# Patient Record
Sex: Female | Born: 1970
Health system: Southern US, Community
[De-identification: ages and names within clinical notes are randomized; demographics above are authoritative.]

## PROBLEM LIST (undated history)

## (undated) DIAGNOSIS — IMO0002 Reserved for concepts with insufficient information to code with codable children: Secondary | ICD-10-CM

## (undated) DIAGNOSIS — E7800 Pure hypercholesterolemia, unspecified: Secondary | ICD-10-CM

## (undated) DIAGNOSIS — I10 Essential (primary) hypertension: Secondary | ICD-10-CM

## (undated) HISTORY — DX: Reserved for concepts with insufficient information to code with codable children: IMO0002

## (undated) HISTORY — PX: TUBAL LIGATION: SHX77

## (undated) HISTORY — PX: COLPOSCOPY: SHX161

## (undated) HISTORY — PX: KNEE SURGERY: SHX244

## (undated) HISTORY — DX: Essential (primary) hypertension: I10

## (undated) HISTORY — DX: Pure hypercholesterolemia, unspecified: E78.00

---

## 1997-10-06 ENCOUNTER — Other Ambulatory Visit: Admission: RE | Admit: 1997-10-06 | Discharge: 1997-10-06 | Payer: Self-pay | Admitting: Obstetrics and Gynecology

## 1998-11-26 ENCOUNTER — Other Ambulatory Visit: Admission: RE | Admit: 1998-11-26 | Discharge: 1998-11-26 | Payer: Self-pay | Admitting: Obstetrics and Gynecology

## 1999-12-08 ENCOUNTER — Other Ambulatory Visit: Admission: RE | Admit: 1999-12-08 | Discharge: 1999-12-08 | Payer: Self-pay | Admitting: Obstetrics and Gynecology

## 2000-03-01 ENCOUNTER — Encounter: Payer: Self-pay | Admitting: Urology

## 2000-03-01 ENCOUNTER — Encounter: Admission: RE | Admit: 2000-03-01 | Discharge: 2000-03-01 | Payer: Self-pay | Admitting: Urology

## 2000-12-12 ENCOUNTER — Other Ambulatory Visit: Admission: RE | Admit: 2000-12-12 | Discharge: 2000-12-12 | Payer: Self-pay | Admitting: Obstetrics and Gynecology

## 2001-04-03 ENCOUNTER — Encounter: Admission: RE | Admit: 2001-04-03 | Discharge: 2001-07-02 | Payer: Self-pay | Admitting: Gynecology

## 2001-09-17 ENCOUNTER — Encounter (INDEPENDENT_AMBULATORY_CARE_PROVIDER_SITE_OTHER): Payer: Self-pay | Admitting: Specialist

## 2001-09-17 ENCOUNTER — Inpatient Hospital Stay (HOSPITAL_COMMUNITY): Admission: AD | Admit: 2001-09-17 | Discharge: 2001-09-20 | Payer: Self-pay | Admitting: Gynecology

## 2001-10-30 ENCOUNTER — Other Ambulatory Visit: Admission: RE | Admit: 2001-10-30 | Discharge: 2001-10-30 | Payer: Self-pay | Admitting: Gynecology

## 2002-06-05 ENCOUNTER — Other Ambulatory Visit: Admission: RE | Admit: 2002-06-05 | Discharge: 2002-06-05 | Payer: Self-pay | Admitting: Gynecology

## 2002-12-01 ENCOUNTER — Other Ambulatory Visit: Admission: RE | Admit: 2002-12-01 | Discharge: 2002-12-01 | Payer: Self-pay | Admitting: Gynecology

## 2004-01-05 ENCOUNTER — Other Ambulatory Visit: Admission: RE | Admit: 2004-01-05 | Discharge: 2004-01-05 | Payer: Self-pay | Admitting: Gynecology

## 2004-09-02 ENCOUNTER — Other Ambulatory Visit: Admission: RE | Admit: 2004-09-02 | Discharge: 2004-09-02 | Payer: Self-pay | Admitting: Gynecology

## 2005-03-14 ENCOUNTER — Encounter (INDEPENDENT_AMBULATORY_CARE_PROVIDER_SITE_OTHER): Payer: Self-pay | Admitting: Specialist

## 2005-03-14 ENCOUNTER — Inpatient Hospital Stay (HOSPITAL_COMMUNITY): Admission: RE | Admit: 2005-03-14 | Discharge: 2005-03-17 | Payer: Self-pay | Admitting: Gynecology

## 2005-04-20 ENCOUNTER — Other Ambulatory Visit: Admission: RE | Admit: 2005-04-20 | Discharge: 2005-04-20 | Payer: Self-pay | Admitting: Gynecology

## 2005-11-09 ENCOUNTER — Other Ambulatory Visit: Admission: RE | Admit: 2005-11-09 | Discharge: 2005-11-09 | Payer: Self-pay | Admitting: Gynecology

## 2005-12-23 HISTORY — PX: ENDOMETRIAL ABLATION: SHX621

## 2005-12-28 ENCOUNTER — Ambulatory Visit (HOSPITAL_COMMUNITY): Admission: RE | Admit: 2005-12-28 | Discharge: 2005-12-28 | Payer: Self-pay | Admitting: Gynecology

## 2005-12-28 ENCOUNTER — Encounter (INDEPENDENT_AMBULATORY_CARE_PROVIDER_SITE_OTHER): Payer: Self-pay | Admitting: Specialist

## 2006-01-23 DIAGNOSIS — IMO0002 Reserved for concepts with insufficient information to code with codable children: Secondary | ICD-10-CM

## 2006-01-23 HISTORY — DX: Reserved for concepts with insufficient information to code with codable children: IMO0002

## 2006-04-30 ENCOUNTER — Other Ambulatory Visit: Admission: RE | Admit: 2006-04-30 | Discharge: 2006-04-30 | Payer: Self-pay | Admitting: Gynecology

## 2006-10-30 ENCOUNTER — Other Ambulatory Visit: Admission: RE | Admit: 2006-10-30 | Discharge: 2006-10-30 | Payer: Self-pay | Admitting: Gynecology

## 2006-11-24 HISTORY — PX: CERVICAL BIOPSY  W/ LOOP ELECTRODE EXCISION: SUR135

## 2007-05-01 ENCOUNTER — Other Ambulatory Visit: Admission: RE | Admit: 2007-05-01 | Discharge: 2007-05-01 | Payer: Self-pay | Admitting: Gynecology

## 2007-11-19 ENCOUNTER — Encounter: Payer: Self-pay | Admitting: Gynecology

## 2007-11-19 ENCOUNTER — Other Ambulatory Visit: Admission: RE | Admit: 2007-11-19 | Discharge: 2007-11-19 | Payer: Self-pay | Admitting: Gynecology

## 2007-11-19 ENCOUNTER — Ambulatory Visit: Payer: Self-pay | Admitting: Gynecology

## 2008-04-29 ENCOUNTER — Ambulatory Visit: Payer: Self-pay | Admitting: Gynecology

## 2008-04-29 ENCOUNTER — Encounter: Payer: Self-pay | Admitting: Gynecology

## 2008-04-29 ENCOUNTER — Other Ambulatory Visit: Admission: RE | Admit: 2008-04-29 | Discharge: 2008-04-29 | Payer: Self-pay | Admitting: Gynecology

## 2008-05-11 ENCOUNTER — Other Ambulatory Visit: Admission: RE | Admit: 2008-05-11 | Discharge: 2008-05-11 | Payer: Self-pay | Admitting: Gynecology

## 2008-05-11 ENCOUNTER — Encounter: Payer: Self-pay | Admitting: Gynecology

## 2008-05-11 ENCOUNTER — Ambulatory Visit: Payer: Self-pay | Admitting: Gynecology

## 2008-06-26 ENCOUNTER — Ambulatory Visit: Payer: Self-pay | Admitting: Gynecology

## 2008-09-29 ENCOUNTER — Ambulatory Visit: Payer: Self-pay | Admitting: Gynecology

## 2008-11-12 ENCOUNTER — Encounter: Payer: Self-pay | Admitting: Gynecology

## 2008-11-12 ENCOUNTER — Other Ambulatory Visit: Admission: RE | Admit: 2008-11-12 | Discharge: 2008-11-12 | Payer: Self-pay | Admitting: Gynecology

## 2008-11-12 ENCOUNTER — Ambulatory Visit: Payer: Self-pay | Admitting: Gynecology

## 2009-04-30 ENCOUNTER — Other Ambulatory Visit: Admission: RE | Admit: 2009-04-30 | Discharge: 2009-04-30 | Payer: Self-pay | Admitting: Gynecology

## 2009-04-30 ENCOUNTER — Ambulatory Visit: Payer: Self-pay | Admitting: Gynecology

## 2009-10-06 ENCOUNTER — Ambulatory Visit: Payer: Self-pay | Admitting: Gynecology

## 2010-05-02 ENCOUNTER — Encounter: Payer: Self-pay | Admitting: Gynecology

## 2010-05-16 ENCOUNTER — Encounter (INDEPENDENT_AMBULATORY_CARE_PROVIDER_SITE_OTHER): Payer: BC Managed Care – PPO | Admitting: Gynecology

## 2010-05-16 ENCOUNTER — Other Ambulatory Visit (HOSPITAL_COMMUNITY)
Admission: RE | Admit: 2010-05-16 | Discharge: 2010-05-16 | Disposition: A | Discharge status: Home / Self Care | Payer: BC Managed Care – PPO | Source: Ambulatory Visit | Attending: Gynecology | Admitting: Gynecology

## 2010-05-16 ENCOUNTER — Other Ambulatory Visit: Payer: Self-pay | Admitting: Gynecology

## 2010-05-16 DIAGNOSIS — Z13220 Encounter for screening for lipoid disorders: Secondary | ICD-10-CM

## 2010-05-16 DIAGNOSIS — Z124 Encounter for screening for malignant neoplasm of cervix: Secondary | ICD-10-CM | POA: Insufficient documentation

## 2010-05-16 DIAGNOSIS — Z01419 Encounter for gynecological examination (general) (routine) without abnormal findings: Secondary | ICD-10-CM

## 2010-05-16 DIAGNOSIS — Z833 Family history of diabetes mellitus: Secondary | ICD-10-CM

## 2010-06-10 NOTE — H&P (Signed)
   Lisa Wang, Lisa Wang                           ACCOUNT NO.:  [id number]   MEDICAL RECORD NO.:  [id number]                   PATIENT TYPE:  INP   LOCATION:  9143                                 FACILITY:  [facility]   PHYSICIAN:  Timothy P. Fontaine, M.D.           DATE OF BIRTH:  09/02/1970   DATE OF ADMISSION:  09/17/2001  DATE OF DISCHARGE:                                HISTORY & PHYSICAL   A 40 year old G-1, P-0 at [redacted] weeks gestation originally scheduled for  primary cesarean section due to breech presentation as per already dictated  history and physical for September 20, 2001. The patient presented today in  labor and is admitted for her cesarean section.  Please see the remainder of  her previously dictated H&P.                                               Timothy P. Audie Box, M.D.    TPF/MEDQ  D:  09/17/2001  T:  09/17/2001  Job:  [id number]

## 2010-06-10 NOTE — Op Note (Signed)
Lisa Wang, Lisa Wang                 ACCOUNT NO.:  [id number]   MEDICAL RECORD NO.:  [id number]          PATIENT TYPE:  INP   LOCATION:  9199                          FACILITY:  [facility]   PHYSICIAN:  Timothy P. Fontaine, M.D.DATE OF BIRTH:  Mar 20, 1970   DATE OF PROCEDURE:  03/14/2005  DATE OF DISCHARGE:                                 OPERATIVE REPORT   PREOPERATIVE DIAGNOSES:  1.  Term pregnancy.  2.  Prior cesarean section for repeat cesarean section.  3.  Desires permanent sterilization.   POSTOPERATIVE DIAGNOSES:  1.  Term pregnancy.  2.  Prior cesarean section for repeat cesarean section.  3.  Desires permanent sterilization.  4.  Left broad ligament leiomyoma.   PROCEDURE:  Repeat low transverse cervical cesarean section and bilateral  tubal sterilization, excision left broad ligament leiomyoma.   SURGEON:  Timothy P. Fontaine, M.D.   ASSISTANT:  Ivor Costa. Farrel Gobble, M.D.   ANESTHETIC:  Spinal.   SPECIMEN:  1.  Samples of cord blood.  2.  Cord blood banking.  3.  Left broad ligament leiomyoma.  4.  Portions of left and right fallopian tubes.   ESTIMATED BLOOD LOSS:  Less than 500 mL.   COMPLICATIONS:  None.   FINDINGS:  At 7:45 normal female infant, Apgars 09/09, weight 8 pounds 10  ounces. Pelvic anatomy overall was noted to be normal with exception of a  small 1.5-2 cm firm the left broad ligament mass consistent with a free  leiomyoma which was excised. The remainder of the pelvic anatomy was noted  to be normal.   PROCEDURE:  The patient was taken to the operating room, underwent spinal  anesthesia, was placed left tilt supine position, received abdominal  preparation with Betadine solution. Bladder emptied with indwelling Foley  catheterization placed in sterile technique per nursing personnel. The  patient was draped in usual fashion. After assuring adequate anesthesia the  abdomen sharply entered through repeat Pfannenstiel incision achieving  adequate  hemostasis at all levels. The bladder flap was sharply bluntly  developed without difficulty. The uterus was sharply entered in the lower  uterine segment bluntly extended laterally. The membranes were ruptured. The  fluid noted to be clear. The infant's head was delivered through the  incision. Nares and mouth were suctioned. Nuchal cord x1 reduced. The rest  infant delivered. Cord doubly clamped and cut. Infant handed to pediatrics  in attendance. Samples of cord blood were obtained. Placenta was  spontaneously extruded and passed off for cord blood banking retrieval.  Uterus was exteriorized. Endometrial cavity explored with sponge to remove  all placental membrane fragments. The patient received 1 gram antibiotic  prophylaxis at this time. The uterine incision was closed in two layers  using 0 Vicryl suture, first a running interlocking stitch followed by  imbricating stitch. The right fallopian tube was identified, traced from its  insertion to its fimbriated end. Mid tubal segment was doubly ligated using  0 plain suture and the intervening segment excised. Tubal lumen as well as  adequate hemostasis was grossly identified. A similar procedure was carried  out  on the other side. The left broad ligament mass was identified and the  overlying peritoneum was sharply incised. The mass was bluntly and delivered  sent to pathology. The 0 Vicryl interrupted the suture was placed for  hemostasis. The uterus was then returned to the abdomen which was copiously  irrigated and adequate hemostasis was visualized. The anterior fascia was  then reapproximated using 0 Vicryl suture in a running stitch starting at  the angle, meeting in the middle. Subcutaneous tissues irrigated. Adequate  hemostasis achieved with electrocautery. Skin was reapproximated using 4-0  Vicryl in running subcuticular stitch. Steri-Strips, Benzoin applied.  Pressure dressing applied. The patient was taken to recovery room in  good  condition having tolerated procedure well.      Timothy P. Fontaine, M.D.  Electronically Signed     TPF/MEDQ  D:  03/14/2005  T:  03/14/2005  Job:  [id number]

## 2010-06-10 NOTE — Discharge Summary (Signed)
[name], [name]                 ACCOUNT NO.:  [id number]   MEDICAL RECORD NO.:  [id number]          PATIENT TYPE:  INP   LOCATION:  9113                          [facility]ACILITY:  [facility]   PHYSICIAN:  Timothy P. Fontaine, M.D.DATE OF BIRTH:  04/05/1970   DATE OF ADMISSION:  03/14/2005  DATE OF DISCHARGE:  03/17/2005                                 DISCHARGE SUMMARY   DISCHARGE DIAGNOSES:  1.  Term pregnancy.  2.  Prior cesarean section for repeat cesarean section.  3.  Desires permanent sterilization.  4.  Left broad ligament leiomyomata.   PROCEDURE:  1.  Repeat low transverse cervical cesarean section.  2.  Bilateral tubal sterilization.  3.  Excision left broad ligament leiomyomata March 14, 2005.   HOSPITAL COURSE:  Patient underwent above procedure without difficulty.  Her  postoperative course was uncomplicated and she was discharged on  postoperative day #3, ambulating well, following a regular diet with a  postoperative hemoglobin of 10.  The patient was noted at the time of  discharge to have an intense erythema around her incision that was very well  demarcated and was in the area of Benzoin application and was felt to be an  allergic reaction to the Benzoin and not an infectious etiology.  The  patient is to have her Steri-Strips removed.  Will monitor this area.  Was  given ASAP call precautions.  If this area would persist or worsen she would  follow up in the office for reevaluation.  The patient received precautions,  instructions, and follow-up.  Will be seen in the office in six weeks.  She  is Rh negative.  She did receive RhoGAM March 15, 2005.  Patient is also  rubella titer positive.      Timothy P. Fontaine, M.D.  Electronically Signed     TPF/MEDQ  D:  03/17/2005  T:  03/17/2005  Job:  [id number]

## 2010-06-10 NOTE — Discharge Summary (Signed)
   NAMEHELANE, BRICENO                           ACCOUNT NO.:  [id number]   MEDICAL RECORD NO.:  [id number]                   PATIENT TYPE:  INP   LOCATION:  9143                                 [facility]   PHYSICIAN:  Devin M. Ciliberti, M.D.            DATE OF BIRTH:  08/29/1970   DATE OF ADMISSION:  09/17/2001  DATE OF DISCHARGE:  09/20/2001                                 DISCHARGE SUMMARY   DISCHARGE DIAGNOSES:  1. Intrauterine pregnancy at term.  2. Breech presentation in labor.  3. Decreased amniotic fluid index.   PROCEDURES:  Low cervical transverse cesarean section under spinal  anesthesia with deliver of a viable infant.   HISTORY OF PRESENT ILLNESS:  The patient is a 40 year old primigravida with  an LMP of December 25, 2001; [facility] October 01, 2001.   PRENATAL LABORATORY DATA:  Blood type O negative, antibody screen negative.  RPR, HBsAg, HIV nonreactive.   HOSPITAL COURSE AND TREATMENT:  The patient was admitted for cesarean  section secondary to breech presentation, labor, and decreased AFI.  Procedure was performed by Dr. Audie Box under spinal anesthesia.  The  patient was delivered of an Apgar 9 and 72 female infant weighing 5 pounds 12  ounces, normal anatomy.   POSTOPERATIVE COURSE:  The patient remained afebrile, had no difficulty  voiding, was able to be discharged on postoperative day #3 in satisfactory  condition.  CBC:  Hematocrit 29.3, hemoglobin 10.4, wbc's 11.9, platelets  255.  The baby was also O negative so the patient did not need RhoGAM.   DISPOSITION:  1. Follow up in six weeks.  2. Continue prenatal vitamins and iron.  3. Appropriate pain medication.     Elwyn Lade . Hancock, N.P.                Devin M. Ciliberti, M.D.    MKH/MEDQ  D:  10/18/2001  T:  10/19/2001  Job:  [id number]

## 2010-06-10 NOTE — Op Note (Signed)
NAMEALIEA, BOBE                 ACCOUNT NO.:  [id number]   MEDICAL RECORD NO.:  [id number]          PATIENT TYPE:  AMB   LOCATION:  SDC                           [facility]   PHYSICIAN:  Timothy P. Fontaine, M.D.DATE OF BIRTH:  1970/05/07   DATE OF PROCEDURE:  12/28/2005  DATE OF DISCHARGE:                               OPERATIVE REPORT   PREOPERATIVE DIAGNOSES:  Menorrhagia.   POSTOPERATIVE DIAGNOSES:  Menorrhagia.   PROCEDURE:  Novasure endometrial ablation, D&C, hysteroscopy.   SURGEON:  Timothy P. Fontaine, M.D.   ANESTHETIC:  General, 1% lidocaine paracervical block.   COMPLICATIONS:  None.   SPECIMEN:  Endometrial curettings.   DISTENDING MEDIUM DISCREPANCY:  Minimal.   FINDINGS:  Examination under anesthesia:  External B U S, vagina normal,  cervix normal, uterus normal size anteverted, adnexa without masses.  Hysteroscopic: Fundus, right and left cornual regions, anterior-  posterior uterine surfaces, lower uterine segment, endocervical canal  grossly normal with good uniform treatment noted. uterine length 9.5,  cervical length 3.5, cavity 6, width 4.2, power 139, treatment length 1  minute 10 seconds.   PROCEDURE:  The patient was taken to the operating room, underwent  general anesthesia was placed low dorsal lithotomy position, received a  perineal vaginal preparation with non-Betadine cleanser due to her  allergies to iodine. The bladder was emptied with in-and-out Foley  catheterization per nursing personnel.  The patient draped in usual  fashion.  EUA performed cervix visualized with a speculum, anterior lip  grasped with single-tooth tenaculum and the cervical length and uterine  lengths were determined.  A paracervical block using 1% lidocaine was  placed without difficulty, a total of 10 cc's used.  The cervix was  gently dilated and endometrial curettings obtained.  The Novasure device  was then placed within the cavity, the wand opened.  Manipulation assured  proper placement.  The dimensions were calculated as per end of the  operative note.  The carbon dioxide test was then performed and passed  and subsequently the treatment was performed without difficulty.  The  instrument was removed.  Hysteroscopy performed.  Good distension, no  evidence of perforation with a normal-appearing cavity with a uniform  treatment pattern.  The tenaculum was then removed.  There was oozing  from the tenaculum sites on the  anterior lip of the cervix and 3-0 chromic interrupted sutures were  placed to achieve hemostasis.  The patient was then placed in supine  position awakened without difficulty and taken to recovery room in good  condition having tolerated procedure well.      Timothy P. Fontaine, M.D.  Electronically Signed     TPF/MEDQ  D:  12/28/2005  T:  12/28/2005  Job:  [id number]

## 2010-06-10 NOTE — H&P (Signed)
NAMELINDZEE, Lisa Wang                 ACCOUNT NO.:  [id number]   MEDICAL RECORD NO.:  [id number]          PATIENT TYPE:  INP   LOCATION:  NA                            [facility]   PHYSICIAN:  Timothy P. Fontaine, M.D.DATE OF BIRTH:  05/28/1970   DATE OF ADMISSION:  03/14/2005  DATE OF DISCHARGE:                                HISTORY & PHYSICAL   CHIEF COMPLAINT:  Pregnancy at term, prior cesarean section for repeat  cesarean section   HISTORY OF PRESENT ILLNESS:  A 40 year old G2, P28, female at term gestation  with history of prior cesarean section, who desires repeat cesarean section.  At the time of this dictation the patient is also contemplating permanent  sterilization.  The patient's prenatal course has been normal.  She is Rh  negative.  See the [facility] full details   PHYSICAL EXAMINATION:  HEENT: Normal.  LUNGS:  Lungs:  Clear.  CARDIAC:  Regular rate.  No rubs, murmurs or gallops.  ABDOMEN:  Gravid, vertex fetus, appropriate for term, positive fetal heart  tones.  PELVIC:  Deferred.   ASSESSMENT/PLAN:  A 40 year old G2, P36, female term gestation, history of  prior cesarean section for repeat cesarean section, tentative tubal  sterilization.  The risks, benefits, indications and alternatives for the  procedures were reviewed with the patient, the expected intraoperative,  postoperative courses.  I reviewed permanent sterilization with her.  She  understands that it is permanent although there is a risk of failure, and  she understands and accepts this.  At the time of this dictation the patient  is making her final decision as to whether she wants to proceed with this or  not.  I reviewed the risks of the cesarean to include infection, prolonged  antibiotics, abscess drainage, reoperation for abscess drainage; the risks  of wound complication requiring opening and draining of incisions, closure  by secondary intention; the risks of hemorrhage necessitating  transfusion  and risks of transfusion were reviewed; the risks of inadvertent injury to  internal organs including bowel, bladder, ureters, vessels and nerves  necessitating major exploratory reparative surgeries and future reparative  surgeries including ostomy formation was all discussed.  The risks of fetal  injury including musculoskeletal, neural and scalpel ,was all discussed,  understood and accepted.  The patient's questions were answered to her  satisfaction, and she is ready to proceed with surgery.      Timothy P. Fontaine, M.D.  Electronically Signed     TPF/MEDQ  D:  02/21/2005  T:  02/21/2005  Job:  [id number]

## 2010-06-10 NOTE — H&P (Signed)
[name], SALLEY                 ACCOUNT NO.:  [id number]   MEDICAL RECORD NO.:  [id number]          PATIENT TYPE:  AMB   LOCATION:  SDC                           [facility]   PHYSICIAN:  Timothy P. Fontaine, M.D.DATE OF BIRTH:  Feb 22, 1970   DATE OF ADMISSION:  12/29/2005  DATE OF DISCHARGE:                              HISTORY & PHYSICAL   CHIEF COMPLAINT:  Menorrhagia.   HISTORY OF PRESENT ILLNESS:  A 40 year old G2, P2 female status post  tubal sterilization with increasing menorrhagia.  The patient notes  periods are lasting 7 days, multiple pad changes, bleed through episodes  since her delivery the year prior.  Alternatives for control were  reviewed to include expectant management hormonal manipulation such as  low-dose birth control pills, IUD endometrial ablation were all  discussed.  She had a normal negative sonohysterogram, and she wants to  proceed with ablation.   PAST MEDICAL HISTORY:  Includes anxiety.   PAST SURGICAL HISTORY:  1. Cesarean section x2 with BTL.  2. Knee surgery.   CURRENT MEDICATIONS:  Wellbutrin 150 XL daily.   ALLERGIES:  PENICILLIN, BENZOIN.   REVIEW OF SYSTEMS:  Noncontributory.   FAMILY HISTORY:  Noncontributory.   SOCIAL HISTORY:  Noncontributory.   PHYSICAL EXAMINATION:  VITAL SIGNS:  Afebrile.  Vital signs stable.  HEENT: Normal.  LUNGS: Clear.  CARDIAC: Regular rate without rubs, murmurs or gallops.  ABDOMEN:  Benign.  PELVIC:  External, BUS, vagina normal.  Cervix normal.  Uterus normal  size, midline mobile, nontender.  Adnexa without masses or tenderness.   ASSESSMENT:  A 40 year old G2, P2 female status post tubal  sterilization, increasing menorrhagia, becoming socially unacceptable to  her with a normal ultrasound.  No intracavitary or uterine  abnormalities.  Options for management were reviewed.  She elects for  endometrial ablation.  I discussed with her the issues of endometrial  ablation, the short-term and  long-term issues, expected  intraoperative/postoperative courses, the acute  intraoperative/postoperative risks.  She understands there are no  guarantees as far as menorrhagia relief that her bleeding may continue,  worsen or change following the procedure.  She also understands that she  should never achieve pregnancy following this procedure, that will be  dangerous if indeed she became pregnant.  The patient has been followed  for cervical dysplasia in the past, and she understands that she will  need continued gynecologic care to include cervical screening.  The  acute intraoperative/postoperative risks were reviewed to include the  risk of bleeding, hemorrhage, transfusion, risks of infection  postoperatively requiring prolonged antibiotics.  The risks of the  dilatation, curettage, hysteroscopy and ablation of uterine perforation  leading to internal organ damage, either directly through perforation or  transuterine thermal damage causing damage to bowel, bladder, ureters,  vessels and nerves either immediately recognized or delay recognized  requiring major exploratory reparative surgeries. future reparative  surgeries, bowel resection, ostomy formation all discussed, understood  and accepted.  The patient's questions were answered to her  satisfaction.  She is ready to proceed with surgery.  Timothy P. Fontaine, M.D.  Electronically Signed     TPF/MEDQ  D:  12/25/2005  T:  12/26/2005  Job:  [id number]

## 2010-06-10 NOTE — Op Note (Signed)
NAMEROZELLA, Lisa Wang                           ACCOUNT NO.:  [id number]   MEDICAL RECORD NO.:  [id number]                   PATIENT TYPE:  INP   LOCATION:  9143                                 [facility]   PHYSICIAN:  Timothy P. Fontaine, M.D.           DATE OF BIRTH:  1970/02/05   DATE OF PROCEDURE:  DATE OF DISCHARGE:                                 OPERATIVE REPORT   PREOPERATIVE DIAGNOSES:  1. Pregnancy at term.  2. Homero Fellers breech presentation.  3. Labor.  4. Decreased amniotic fluid index.   POSTOPERATIVE DIAGNOSES:  1. Pregnancy at term.  2. Homero Fellers breech presentation.  3. Labor.  4. Decreased amniotic fluid index.   PROCEDURE:  Primary low transverse cervical cesarean section.   SURGEON:  Timothy P. Fontaine, M.D.   ASSISTANT:  Scrub technician.   ANESTHESIA:  Spinal.   ESTIMATED BLOOD LOSS:  Less than 500 cc.   COMPLICATIONS:  None.   SPECIMENS:  Samples of cord blood, placenta, umbilical cord.   FINDINGS:  At 26, normal female infant in a frank breech presentation,  Apgars 9 and 9, weight 5 pounds 12 ounces.  Pelvic anatomy was noted to be  normal.   DESCRIPTION OF PROCEDURE:  The patient was taken to the operating room and  underwent spinal anesthesia, was placed in the supine position and received  abdominal preparation of Betadine scrub and Betadine solution.  Bladder  emptied with an indwelling Foley catheterization placed, and sterile  technique.  The patient was draped in the usual fashion and after sharing  adequate anesthesia, the abdomen was sharply entered  through a Pfannenstiel  incision, achieving adequate hemostasis at all levels.  The bladder flap was  sharply and bluntly developed without difficulty and the uterus was sharply  entered in the lower uterine segment, bluntly extended laterally.  Bulging  membranes were ruptured.  The infant was found to be in a frank breech  presentation and was converted to a footling breech presentation  and  underwent a classical breech extraction.  The nares and mouth were  suctioned, the cord doubly clamped and cut, and the infant was handed to  pediatrics in attendance.  Samples of cord blood were obtained and the  placenta was then spontaneously extruded, noted to be intact, and was sent  to pathology.  The uterus was exteriorized, the endometrial cavity explored  with a sponge to remove all placental and membrane fragments.  The uterine  incision was then closed in one layer, using 0 Vicryl suture and a running  interlocking stitch.  The uterus was returned to the abdomen, which was  copiously irrigated, showing adequate hemostasis, and the anterior fascia  was then reapproximated using 0 Vicryl suture and a running stitch.  Subcutaneous tissues were irrigated.  Adequate hemostasis achieved with  electrocautery.  The skin was then reapproximated using staples with  intervening Steri-Strips and Benzoin application.  A  sterile dressing was  applied.  The patient was taken to the recovery room in good condition,  having tolerated the procedure well.                                                 Timothy P. Audie Box, M.D.    TPF/MEDQ  D:  09/17/2001  T:  09/17/2001  Job:  [id number]

## 2010-06-10 NOTE — H&P (Signed)
Lisa Wang, Lisa Wang                           ACCOUNT NO.:  [id number]   MEDICAL RECORD NO.:  [id number]                   PATIENT TYPE:  INP   LOCATION:  NA                                   [facility]   PHYSICIAN:  Timothy P. Fontaine, M.D.           DATE OF BIRTH:  1970/08/23   DATE OF ADMISSION:  09/20/2001  DATE OF DISCHARGE:                                HISTORY & PHYSICAL   CHIEF COMPLAINT:  Pregnancy at 39 weeks, breech presentation.  IUGR.   HISTORY OF PRESENT ILLNESS:  A 40 year old G1, P0 female at [redacted] weeks  gestation, evaluated for size less than dates; found to have a fetus with  a  lag of three weeks in growth with good early dating and also to be in the  breech presentation.  The patient has been followed with antepartum testing,  which has been reassuring and she is admitted at this time for a primary  cesarean section due to breech presentation.  Options for attempted version  versus primary section was discussed, and the patient wants to proceed with  section.  I reviewed with her the risks, benefits, indications and  alternatives -- to include the expected intraoperative postoperative  courses.  I reviewed the risks of inadvertent injury to internal organs,  including:  bowel, bladder, ureters, vessels and nerves necessitating  exploratory repair with surgery and the future repair of surgeries,  including ostomy formation.  The risks of bleeding leading to hemorrhage,  necessitating transfusion, and the risks of transfusion including  transfusion reaction hepatitis, HIV, mad cow disease and other unknown  entities was all discussed, understood and accepted.  The risks of infection  both internal requiring prolonged antibiotics, as well as incisional  requiring opening and draining of incisions, closure by secondary to  intention was also discussed, understood and accepted.  The risks of fetal  injury during the procedure, including musculoskeletal, neural  and scalpel  injuries were all reviewed, understood and accepted.  The patient's  questions were answered to her satisfaction.   PRENATAL COURSE:  Per [facility].   PAST SURGICAL HISTORY:  Knee as a child.   PAST MEDICAL HISTORY:  None.   ALLERGIES:  PENICILLIN.   REVIEW OF SYSTEMS:  Noncontributory.   CURRENT MEDICATIONS:  Prenatal vitamins.   SOCIAL HISTORY:  Noncontributory .   PHYSICAL EXAMINATION:  VITAL SIGNS:  Afebrile.  Vital signs are stable.  Exam per [facility] noting current abdominal exam.  ABDOMEN:  Gravid, breech presentation, fundal height 31-32 cm.  Positive  fetal heart tones.  PELVIC:  Deferred.    ASSESSMENT:  A 40 year old G1, P0 at [redacted] weeks gestation, breech  presentation; IUGR for primary cesarean section after counseling for  alternatives -- to include attempted version.  The patient's questions were  answered to her satisfaction.  She is ready to proceed with surgery.  She is  strep negative.  Timothy P. Audie Box, M.D.    TPF/MEDQ  D:  09/12/2001  T:  09/12/2001  Job:  [id number]

## 2010-07-25 ENCOUNTER — Ambulatory Visit (INDEPENDENT_AMBULATORY_CARE_PROVIDER_SITE_OTHER): Payer: BC Managed Care – PPO | Admitting: Gynecology

## 2010-07-25 DIAGNOSIS — B373 Candidiasis of vulva and vagina: Secondary | ICD-10-CM

## 2010-07-25 DIAGNOSIS — L293 Anogenital pruritus, unspecified: Secondary | ICD-10-CM

## 2010-07-25 DIAGNOSIS — N898 Other specified noninflammatory disorders of vagina: Secondary | ICD-10-CM

## 2010-09-14 ENCOUNTER — Telehealth: Payer: Self-pay | Admitting: *Deleted

## 2010-09-14 DIAGNOSIS — L292 Pruritus vulvae: Secondary | ICD-10-CM

## 2010-09-14 MED ORDER — FLUCONAZOLE 150 MG PO TABS: 150.0000 mg | ORAL_TABLET | Freq: Once | ORAL | Status: AC

## 2010-09-14 NOTE — Telephone Encounter (Signed)
PT C/O YEAST INFECTION ITCHING, WHITE DISCHARGE, NO ODOR. PT STATES SHE GETS YEAST INFECTION AT TIMES, SHE WOULD LIKE RX IF POSSIBLE. PLEASE ADVISE.

## 2010-09-14 NOTE — Telephone Encounter (Signed)
Diflucan 150x1 dose follow up if symptoms persist or recur

## 2010-09-14 NOTE — Telephone Encounter (Signed)
Pt informed with the below note. 

## 2010-11-18 ENCOUNTER — Other Ambulatory Visit: Payer: Self-pay | Admitting: Dermatology

## 2011-01-02 ENCOUNTER — Telehealth: Payer: Self-pay | Admitting: *Deleted

## 2011-01-02 NOTE — Telephone Encounter (Signed)
Pt calling c/o yeast x 3 days now. Lm on pt vm that OV is best.

## 2011-01-03 ENCOUNTER — Ambulatory Visit (INDEPENDENT_AMBULATORY_CARE_PROVIDER_SITE_OTHER): Payer: BC Managed Care – PPO | Admitting: Gynecology

## 2011-01-03 ENCOUNTER — Encounter: Payer: Self-pay | Admitting: Gynecology

## 2011-01-03 DIAGNOSIS — N898 Other specified noninflammatory disorders of vagina: Secondary | ICD-10-CM

## 2011-01-03 DIAGNOSIS — B3731 Acute candidiasis of vulva and vagina: Secondary | ICD-10-CM

## 2011-01-03 DIAGNOSIS — B373 Candidiasis of vulva and vagina: Secondary | ICD-10-CM

## 2011-01-03 MED ORDER — FLUCONAZOLE 150 MG PO TABS: 150.0000 mg | ORAL_TABLET | Freq: Once | ORAL | Status: AC

## 2011-01-03 NOTE — Patient Instructions (Signed)
Take Diflucan as instructed.

## 2011-01-03 NOTE — Progress Notes (Signed)
Patient presents with several days of vaginal discharge and itching. She does have a history of recurrent yeast infections over the past year.  Exam with chaperone present External BUS vagina White discharge with KOH wet prep done. Cervix normal. Uterus normal size midline mobile nontender. Adnexa without masses or tenderness.  Assessment and plan: Was positive for yeast. We'll treat with Diflucan 150x1 dose. I gave her 2 refills have available for recurrences. Otherwise assuming she continues well she'll see me when she's due for her annual sooner as needed.

## 2011-02-27 ENCOUNTER — Other Ambulatory Visit: Payer: Self-pay | Admitting: *Deleted

## 2011-02-27 MED ORDER — NITROFURANTOIN MONOHYD MACRO 100 MG PO CAPS: ORAL_CAPSULE | ORAL | Status: DC

## 2011-05-08 ENCOUNTER — Telehealth: Payer: Self-pay | Admitting: *Deleted

## 2011-05-08 NOTE — Telephone Encounter (Signed)
PT CALLED C/O BV INFECTION AND REQUESTING RX FOR THIS. LEFT MESSAGE ON PT VM THAT OV WOULD BE BEST.

## 2011-05-25 ENCOUNTER — Encounter: Payer: Self-pay | Admitting: Gynecology

## 2011-05-25 ENCOUNTER — Ambulatory Visit (INDEPENDENT_AMBULATORY_CARE_PROVIDER_SITE_OTHER): Payer: BC Managed Care – PPO | Admitting: Gynecology

## 2011-05-25 ENCOUNTER — Other Ambulatory Visit (HOSPITAL_COMMUNITY)
Admission: RE | Admit: 2011-05-25 | Discharge: 2011-05-25 | Disposition: A | Discharge status: Home / Self Care | Payer: BC Managed Care – PPO | Source: Ambulatory Visit | Attending: Gynecology | Admitting: Gynecology

## 2011-05-25 VITALS — BP 114/70 | Ht 64.25 in | Wt 136.0 lb

## 2011-05-25 DIAGNOSIS — Z01419 Encounter for gynecological examination (general) (routine) without abnormal findings: Secondary | ICD-10-CM | POA: Insufficient documentation

## 2011-05-25 DIAGNOSIS — Z87410 Personal history of cervical dysplasia: Secondary | ICD-10-CM | POA: Insufficient documentation

## 2011-05-25 DIAGNOSIS — Z131 Encounter for screening for diabetes mellitus: Secondary | ICD-10-CM

## 2011-05-25 DIAGNOSIS — Z13220 Encounter for screening for lipoid disorders: Secondary | ICD-10-CM

## 2011-05-25 LAB — CBC WITH DIFFERENTIAL/PLATELET
HCT: 40.4 % (ref 36.0–46.0)
Hemoglobin: 13.1 g/dL (ref 12.0–15.0)
Lymphocytes Relative: 33 % (ref 12–46)
MCHC: 32.4 g/dL (ref 30.0–36.0)
Monocytes Absolute: 0.4 10*3/uL (ref 0.1–1.0)
Monocytes Relative: 6 % (ref 3–12)
Neutro Abs: 3.3 10*3/uL (ref 1.7–7.7)
WBC: 5.7 10*3/uL (ref 4.0–10.5)

## 2011-05-25 LAB — LIPID PANEL
Cholesterol: 157 mg/dL (ref 0–200)
Total CHOL/HDL Ratio: 3.1 Ratio
Triglycerides: 56 mg/dL (ref <150)
VLDL: 11 mg/dL (ref 0–40)

## 2011-05-25 NOTE — Patient Instructions (Addendum)
Follow up in one year for annual exam.  Schedule mammogram in the fall 2013.

## 2011-05-25 NOTE — Progress Notes (Signed)
Lisa Wang 07/26/1970 [id number]        41 y.o.  for annual exam.  Doing well without complaints  Past medical history,surgical history, medications, allergies, family history and social history were all reviewed and documented in the EPIC chart. ROS:  Was performed and pertinent positives and negatives are included in the history.  Exam: Lisa Wang chaperone present Filed Vitals:   05/25/11 0905  BP: 114/70   General appearance  Normal Skin grossly normal Head/Neck normal with no cervical or supraclavicular adenopathy thyroid normal Lungs  clear Cardiac RR, without RMG Abdominal  soft, nontender, without masses, organomegaly or hernia Breasts  examined lying and sitting without masses, retractions, discharge or axillary adenopathy. Pelvic  Ext/BUS/vagina  normal   Cervix  normal  Pap done  Uterus  anteverted, normal size, shape and contour, midline and mobile nontender   Adnexa  Without masses or tenderness    Anus and perineum  normal   Rectovaginal  normal sphincter tone without palpated masses or tenderness.    Assessment/Plan:  41 y.o. female for annual exam.    1. Status post ablation. Doing well no menses. BTL contraception. 2. Pap smear. History of CIN-2 status post LEEP 2008. Pap smears have been negative since.  Pap smear done today. 3. Mammography. Reviewed screening mammogram recommendations between 35 and 40. Patient turns 41 the end of this year I recommended she schedule her mammogram in the fall. SBE monthly reviewed. 4. Health maintenance. Baseline CBC lipid profile glucose urinalysis ordered. Assuming she continues well from a gynecologic standpoint she will see me in a year, sooner as needed.    Lisa Lords MD, 9:46 AM 05/25/2011

## 2011-05-26 LAB — URINALYSIS W MICROSCOPIC + REFLEX CULTURE
Bacteria, UA: NONE SEEN
Bilirubin Urine: NEGATIVE
Crystals: NONE SEEN
Ketones, ur: NEGATIVE mg/dL
Protein, ur: NEGATIVE mg/dL
Urobilinogen, UA: 0.2 mg/dL (ref 0.0–1.0)

## 2012-01-10 ENCOUNTER — Telehealth: Payer: Self-pay | Admitting: *Deleted

## 2012-01-10 MED ORDER — FLUCONAZOLE 150 MG PO TABS: 150.0000 mg | ORAL_TABLET | Freq: Once | ORAL | Status: DC

## 2012-01-10 NOTE — Telephone Encounter (Signed)
Pt called c/o yeast infection itching, white discharge. Pt was going make OV but couldn't get in to see on her time she has off. Pt is requesting something to help with yeast. Please advise

## 2012-01-10 NOTE — Telephone Encounter (Signed)
Diflucan 150 mg #1, office visit if symptoms continue

## 2012-01-11 NOTE — Telephone Encounter (Signed)
Left message rx sent

## 2012-01-12 ENCOUNTER — Ambulatory Visit: Payer: BC Managed Care – PPO | Admitting: Gynecology

## 2012-01-12 MED ORDER — FLUCONAZOLE 150 MG PO TABS: 150.0000 mg | ORAL_TABLET | Freq: Once | ORAL | Status: DC

## 2012-01-12 NOTE — Addendum Note (Signed)
Addended by: Aura Camps on: 01/12/2012 09:24 AM   Modules accepted: Orders

## 2012-01-12 NOTE — Telephone Encounter (Signed)
Pt called today and requested diflucan be sent to [place].

## 2012-07-02 ENCOUNTER — Encounter: Payer: Self-pay | Admitting: Gynecology

## 2012-07-19 ENCOUNTER — Ambulatory Visit (INDEPENDENT_AMBULATORY_CARE_PROVIDER_SITE_OTHER): Payer: BC Managed Care – PPO | Admitting: Gynecology

## 2012-07-19 ENCOUNTER — Encounter: Payer: Self-pay | Admitting: Gynecology

## 2012-07-19 VITALS — BP 120/70 | Ht 64.0 in | Wt 136.0 lb

## 2012-07-19 DIAGNOSIS — Z13220 Encounter for screening for lipoid disorders: Secondary | ICD-10-CM

## 2012-07-19 DIAGNOSIS — Z01419 Encounter for gynecological examination (general) (routine) without abnormal findings: Secondary | ICD-10-CM

## 2012-07-19 LAB — CBC WITH DIFFERENTIAL/PLATELET
Basophils Absolute: 0 10*3/uL (ref 0.0–0.1)
Basophils Relative: 0 % (ref 0–1)
HCT: 38.3 % (ref 36.0–46.0)
Hemoglobin: 13.4 g/dL (ref 12.0–15.0)
Lymphocytes Relative: 26 % (ref 12–46)
Monocytes Absolute: 0.4 10*3/uL (ref 0.1–1.0)
Neutro Abs: 4 10*3/uL (ref 1.7–7.7)
Neutrophils Relative %: 66 % (ref 43–77)
RDW: 12.7 % (ref 11.5–15.5)
WBC: 6.1 10*3/uL (ref 4.0–10.5)

## 2012-07-19 LAB — COMPREHENSIVE METABOLIC PANEL
ALT: 14 U/L (ref 0–35)
AST: 15 U/L (ref 0–37)
Albumin: 4.3 g/dL (ref 3.5–5.2)
Alkaline Phosphatase: 97 U/L (ref 39–117)
BUN: 10 mg/dL (ref 6–23)
Calcium: 9.4 mg/dL (ref 8.4–10.5)
Chloride: 102 mEq/L (ref 96–112)
Potassium: 3.7 mEq/L (ref 3.5–5.3)
Sodium: 140 mEq/L (ref 135–145)
Total Protein: 6.8 g/dL (ref 6.0–8.3)

## 2012-07-19 LAB — LIPID PANEL
HDL: 62 mg/dL (ref >39)
LDL Cholesterol: 88 mg/dL (ref 0–99)

## 2012-07-19 MED ORDER — NITROFURANTOIN MONOHYD MACRO 100 MG PO CAPS: ORAL_CAPSULE | ORAL | Status: DC

## 2012-07-19 NOTE — Progress Notes (Signed)
ALEIGHNA WOJTAS Mar 04, 1970 [id number]        42 y.o.  G2P2 for annual exam.  Doing well without complaints.  Past medical history,surgical history, medications, allergies, family history and social history were all reviewed and documented in the EPIC chart.  ROS:  Performed and pertinent positives and negatives are included in the history, assessment and plan .  Exam: Kim assistant Filed Vitals:   07/19/12 0834  BP: 120/70  Height: 5\' 4"  (1.626 m)  Weight: 136 lb (61.689 kg)   General appearance  Normal Skin grossly normal Head/Neck normal with no cervical or supraclavicular adenopathy thyroid normal Lungs  clear Cardiac RR, without RMG Abdominal  soft, nontender, without masses, organomegaly or hernia Breasts  examined lying and sitting without masses, retractions, discharge or axillary adenopathy. Pelvic  Ext/BUS/vagina  normal   Cervix  normal   Uterus  anteverted, normal size, shape and contour, midline and mobile nontender   Adnexa  Without masses or tenderness    Anus and perineum  normal   Rectovaginal  normal sphincter tone without palpated masses or tenderness.    Assessment/Plan:  42 y.o. G2P2 female for annual exam.   1. Status post endometrial ablation. Amenorrheic doing well. Without signs or symptoms of menopause. Continue to follow. 2. 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 Pap smear normal afterwards. Last Pap smear 2013. No Pap smear done today. Plan repeat at 3 year interval. 3. Post coital UTIs. Patient using Macrobid 100 mg 1 with coitus as a preventative and doing well with this. #40 with 2 refills provided. 4. Mammography due now patient is to schedule. SBE monthly review. 5. Health maintenance. Baseline CBC comprehensive metabolic panel lipid profile urinalysis ordered. Followup one year, sooner as needed.    Dara Lords MD, 9:00 AM 07/19/2012

## 2012-07-19 NOTE — Patient Instructions (Addendum)
Schedule mammogram. Followup in one year for annual exam.

## 2012-07-20 LAB — URINALYSIS W MICROSCOPIC + REFLEX CULTURE
Casts: NONE SEEN
Crystals: NONE SEEN
Glucose, UA: NEGATIVE mg/dL
Hgb urine dipstick: NEGATIVE
Leukocytes, UA: NEGATIVE
Nitrite: NEGATIVE
Specific Gravity, Urine: 1.022 (ref 1.005–1.030)
Squamous Epithelial / LPF: NONE SEEN
pH: 7 (ref 5.0–8.0)

## 2012-08-12 ENCOUNTER — Telehealth: Payer: Self-pay | Admitting: *Deleted

## 2012-08-12 MED ORDER — FLUCONAZOLE 150 MG PO TABS: 150.0000 mg | ORAL_TABLET | Freq: Once | ORAL | Status: DC

## 2012-08-12 NOTE — Telephone Encounter (Signed)
Pt calling c/o yeast infection itching white discharge and lots of irritation. Pt wont be able to come in for OV until Wednesday, due to work. She asked if Rx could be given due to the bad irritation? Doesn't think she can wait until wed. For Rx. Please advise

## 2012-08-12 NOTE — Telephone Encounter (Signed)
Pt informed with the below note, rx sent. 

## 2012-08-12 NOTE — Telephone Encounter (Signed)
Diflucan 150mg x 1 dose

## 2012-12-05 ENCOUNTER — Other Ambulatory Visit: Payer: Self-pay | Admitting: Rheumatology

## 2012-12-05 DIAGNOSIS — M25561 Pain in right knee: Secondary | ICD-10-CM

## 2012-12-06 ENCOUNTER — Ambulatory Visit (INDEPENDENT_AMBULATORY_CARE_PROVIDER_SITE_OTHER): Payer: BC Managed Care – PPO | Admitting: Women's Health

## 2012-12-06 ENCOUNTER — Encounter: Payer: Self-pay | Admitting: Women's Health

## 2012-12-06 DIAGNOSIS — N898 Other specified noninflammatory disorders of vagina: Secondary | ICD-10-CM

## 2012-12-06 DIAGNOSIS — B373 Candidiasis of vulva and vagina: Secondary | ICD-10-CM

## 2012-12-06 LAB — WET PREP FOR TRICH, YEAST, CLUE

## 2012-12-06 MED ORDER — FLUCONAZOLE 150 MG PO TABS: 150.0000 mg | ORAL_TABLET | Freq: Once | ORAL | Status: DC

## 2012-12-06 NOTE — Progress Notes (Signed)
Patient ID: Lisa Wang, female   DOB: 01-Mar-1970, 42 y.o.   MRN: [id number] Presents with complaint of vaginal discharge with mild itching. Amenorrheic history of ablation 2002. BTL. Denies urinary symptoms, abdominal pain or fever.  Exam: Appears well, external genitalia within normal limits, speculum exam moderate amount of a white discharge, mild erythema noted. Wet prep positive for yeast. Bimanual no CMT or adnexal fullness or tenderness.  Yeast vaginitis  Plan: Diflucan 150 by mouth x1 dose with refill. Yeast prevention reviewed. Declined handout on yeast. Instructed to call if no relief of symptoms.

## 2013-01-08 ENCOUNTER — Encounter: Payer: Self-pay | Admitting: Women's Health

## 2013-01-08 ENCOUNTER — Ambulatory Visit (INDEPENDENT_AMBULATORY_CARE_PROVIDER_SITE_OTHER): Payer: BC Managed Care – PPO | Admitting: Women's Health

## 2013-01-08 DIAGNOSIS — L738 Other specified follicular disorders: Secondary | ICD-10-CM

## 2013-01-08 DIAGNOSIS — L678 Other hair color and hair shaft abnormalities: Secondary | ICD-10-CM

## 2013-01-08 DIAGNOSIS — L739 Follicular disorder, unspecified: Secondary | ICD-10-CM

## 2013-01-08 NOTE — Progress Notes (Signed)
Patient ID: Lisa Wang, female   DOB: 09-Aug-1970, 42 y.o.   MRN: [id number]  presents with a questionable hair follicle bump. States bump was larger, drained small amount of a white material, now less tender. History of ablation/amenorrheic. BTL. Denies vaginal discharge, urinary symptoms, abdominal pain or fever.  Exam: Appears well. External genitalia 2 small resolving folliculitis on the right labia. Mild erythema with minimal tenderness.  Resolving folliculitis  Plan: Loose clothing, Triple Antibiotic ointment at  bedtime. Instructed to call if symptoms do not resolve.

## 2013-03-25 ENCOUNTER — Ambulatory Visit (INDEPENDENT_AMBULATORY_CARE_PROVIDER_SITE_OTHER): Payer: BC Managed Care – PPO | Admitting: Gynecology

## 2013-03-25 ENCOUNTER — Encounter: Payer: Self-pay | Admitting: Gynecology

## 2013-03-25 DIAGNOSIS — N898 Other specified noninflammatory disorders of vagina: Secondary | ICD-10-CM

## 2013-03-25 DIAGNOSIS — N949 Unspecified condition associated with female genital organs and menstrual cycle: Secondary | ICD-10-CM

## 2013-03-25 LAB — WET PREP FOR TRICH, YEAST, CLUE
Clue Cells Wet Prep HPF POC: NONE SEEN
Trich, Wet Prep: NONE SEEN
WBC, Wet Prep HPF POC: NONE SEEN
Yeast Wet Prep HPF POC: NONE SEEN

## 2013-03-25 NOTE — Progress Notes (Signed)
Lisa Wang [birth year] [id number]        42 y.o.  G2P2 presents with the onset of vaginal odor with slight discharge end of last week. Develop some dysuria and started on a prescription of Macrobid twice a day that she had at home. Notes that the vaginal odor and discharge seemed to be getting better but just wanted to be checked.  Past medical history,surgical history, problem list, medications, allergies, family history and social history were all reviewed and documented in the EPIC chart.  Exam: Kim assistant General appearance  Normal External BUS vagina with scant discharge. Cervix normal. Uterus normal size midline mobile nontender. Adnexa without masses or tenderness.  Assessment/Plan:  44 y.o. G2P2  with history as above. Wet prep is negative. Recommend completing seven-day course of Macrobid and monitoring symptoms. If return of vaginal odor or discharge we'll followup otherwise if resolves then routine followup.   Note: This document was prepared with digital dictation and possible smart phrase technology. Any transcriptional errors that result from this process are unintentional.   Anastasio Auerbach MD, 3:36 PM 03/25/2013

## 2013-03-25 NOTE — Patient Instructions (Signed)
Complete the oral antibiotics. Call if vaginal odor or discharge continues.

## 2013-03-26 LAB — URINALYSIS W MICROSCOPIC + REFLEX CULTURE
BILIRUBIN URINE: NEGATIVE
CRYSTALS: NONE SEEN
Casts: NONE SEEN
Glucose, UA: NEGATIVE mg/dL
Hgb urine dipstick: NEGATIVE
KETONES UR: NEGATIVE mg/dL
Leukocytes, UA: NEGATIVE
Nitrite: NEGATIVE
Protein, ur: NEGATIVE mg/dL
SPECIFIC GRAVITY, URINE: 1.013 (ref 1.005–1.030)
UROBILINOGEN UA: 0.2 mg/dL (ref 0.0–1.0)
pH: 6.5 (ref 5.0–8.0)

## 2013-06-18 ENCOUNTER — Encounter: Payer: Self-pay | Admitting: Gynecology

## 2013-09-15 ENCOUNTER — Encounter: Payer: Self-pay | Admitting: Gynecology

## 2013-09-15 ENCOUNTER — Ambulatory Visit (INDEPENDENT_AMBULATORY_CARE_PROVIDER_SITE_OTHER): Payer: BC Managed Care – PPO | Admitting: Gynecology

## 2013-09-15 DIAGNOSIS — N9089 Other specified noninflammatory disorders of vulva and perineum: Secondary | ICD-10-CM

## 2013-09-15 NOTE — Progress Notes (Signed)
Patient ID: Lisa Wang, female   DOB: 07-11-1970, 43 y.o.   MRN: [id number] Lisa Wang 14-Jul-1970 [id number]        43 y.o.  G2P2 presents with 6 month history of small nodule right labia slowly increasing in size. Nontender or draining. No history of similar before.  Past medical history,surgical history, problem list, medications, allergies, family history and social history were all reviewed and documented in the EPIC chart.  Directed ROS with pertinent positives and negatives documented in the history of present illness/assessment and plan.  Exam: Kim assistant General appearance:  Normal External BUS vagina with small cutaneous nodule base of right mid labia minora with the junction of the labia majora. Small pigmented area mid posterior fourchette. Cervix normal. Uterus normal size midline mobile nontender. Adnexa without masses or tenderness. Physical Exam  Genitourinary:      Procedure: The skin overlying both areas were cleansed with Betadine, infiltrated with 1% lidocaine and then both areas were excised in their entirety and sent to pathology separately. Silver nitrate hemostasis applied to both areas. Postoperative instructions given to the patient.    Assessment/Plan:  43 y.o. G2P2  with reported small cystic area base of right mid labia minora consistent with a small cutaneous cyst or possible small adnexal tumor. Benign historically been present for 6 months. Small pigmented area mid posterior fourchette not previously appreciated. Options are observation versus excision discussed and given the difficult area to observe we both agreed on excision. This area was completely excised with no remaining pigmented area. The specimen sent to pathology and patient will followup for pathology results. She is overdue for her annual exam and I reminded her to schedule this.   Note: This document was prepared with digital dictation and possible smart phrase technology. Any  transcriptional errors that result from this process are unintentional.   Anastasio Auerbach MD, 5:08 PM 09/15/2013

## 2013-09-15 NOTE — Patient Instructions (Signed)
Use sitz baths as needed for discomfort. Call me if you have any questions or issues. Office will call you with biopsy results in several days

## 2013-09-18 ENCOUNTER — Telehealth: Payer: Self-pay

## 2013-09-18 NOTE — Telephone Encounter (Signed)
It will form a scab and since that is a moist area not unexpected that I would look like that. As long as there is no redness or significant drainage then I would continue to monitor. She may want to do sitz baths where she gets that area washed off and then she can apply a small amount of bacitracin or other topical antibiotic. I suspect regardless it would heal up fairly quickly. If there is any question after the weekend recommend office visit and I can take a look.

## 2013-09-18 NOTE — Telephone Encounter (Signed)
Patient wanted me to check with you regarding the area where you excised the lesion.  She said it is still very tender which she expects. When she looked at it today it was a greenish, tannish color. She related it to when you have a scab on your skin and it gets wet what that scab looks like.  She said there is no redness around it and except for being a little tender all else is fine. She feels like all if okay but wanted me to run it by you.

## 2013-09-18 NOTE — Telephone Encounter (Signed)
Patient advised.

## 2013-09-18 NOTE — Telephone Encounter (Signed)
Message copied by Ramond Craver on Thu Sep 18, 2013 10:26 AM ------      Message from: Lisa Wang      Created: Wed Sep 17, 2013  5:10 PM       Tell patient the biopsies were both benign. 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 That is what I had thought when I excised it it was not cystic. ------

## 2013-09-23 ENCOUNTER — Ambulatory Visit: Payer: BC Managed Care – PPO | Admitting: Gynecology

## 2013-10-01 ENCOUNTER — Encounter: Payer: BC Managed Care – PPO | Admitting: Gynecology

## 2013-11-24 ENCOUNTER — Encounter: Payer: Self-pay | Admitting: Gynecology

## 2013-12-29 ENCOUNTER — Other Ambulatory Visit: Payer: Self-pay | Admitting: Gynecology

## 2014-01-01 ENCOUNTER — Encounter: Payer: Self-pay | Admitting: Women's Health

## 2014-01-01 ENCOUNTER — Ambulatory Visit (INDEPENDENT_AMBULATORY_CARE_PROVIDER_SITE_OTHER): Payer: BC Managed Care – PPO | Admitting: Women's Health

## 2014-01-01 VITALS — BP 126/80 | Ht 64.0 in | Wt 143.0 lb

## 2014-01-01 DIAGNOSIS — R350 Frequency of micturition: Secondary | ICD-10-CM

## 2014-01-01 LAB — URINALYSIS W MICROSCOPIC + REFLEX CULTURE
Bilirubin Urine: NEGATIVE
GLUCOSE, UA: NEGATIVE mg/dL
HGB URINE DIPSTICK: NEGATIVE
Ketones, ur: NEGATIVE mg/dL
Leukocytes, UA: NEGATIVE
Nitrite: NEGATIVE
PROTEIN: NEGATIVE mg/dL
Specific Gravity, Urine: 1.02 (ref 1.005–1.030)
Urobilinogen, UA: 0.2 mg/dL (ref 0.0–1.0)
pH: 6 (ref 5.0–8.0)

## 2014-01-01 MED ORDER — NITROFURANTOIN MONOHYD MACRO 100 MG PO CAPS: ORAL_CAPSULE | ORAL | Status: DC

## 2014-01-01 NOTE — Progress Notes (Signed)
Patient ID: Lisa Wang, female   DOB: 05/23/1970, 43 y.o.   MRN: [id number] Presents with 3 day history of urinary increased frequency initially had burning and pain. History of recurrent UTIs. Has a standing prescription for Macrobid to be used with intercourse as needed, took one tablet 3 days ago when frequency started , 2 tablets yesterday, symptom free today. Had no more Macrobid, uses infrequently. Denies abdominal pain, fever, back pain, vaginal discharge. Amenorrheic, ablation and BTL.  Exam: Appears well. No CVAT. Abdomen soft no rebound or radiation of pain. External genitalia within normal limits no erythema noted. UA: Negative  Resolving urinary frequency.   Plan: Urine culture pending. Refill of Macrobid 100 by mouth as needed given. Has scheduled annual exam in January. UTI prevention reviewed. Instructed to call if further problems.

## 2014-01-02 LAB — URINE CULTURE
Colony Count: NO GROWTH
Organism ID, Bacteria: NO GROWTH

## 2014-02-24 ENCOUNTER — Encounter: Payer: BC Managed Care – PPO | Admitting: Gynecology

## 2014-02-25 ENCOUNTER — Ambulatory Visit (INDEPENDENT_AMBULATORY_CARE_PROVIDER_SITE_OTHER): Payer: BLUE CROSS/BLUE SHIELD | Admitting: Gynecology

## 2014-02-25 ENCOUNTER — Encounter: Payer: Self-pay | Admitting: Gynecology

## 2014-02-25 ENCOUNTER — Other Ambulatory Visit (HOSPITAL_COMMUNITY)
Admission: RE | Admit: 2014-02-25 | Discharge: 2014-02-25 | Disposition: A | Discharge status: Home / Self Care | Payer: BLUE CROSS/BLUE SHIELD | Source: Ambulatory Visit | Attending: Gynecology | Admitting: Gynecology

## 2014-02-25 VITALS — BP 112/66 | Ht 65.0 in | Wt 140.0 lb

## 2014-02-25 DIAGNOSIS — N946 Dysmenorrhea, unspecified: Secondary | ICD-10-CM

## 2014-02-25 DIAGNOSIS — Z01419 Encounter for gynecological examination (general) (routine) without abnormal findings: Secondary | ICD-10-CM | POA: Insufficient documentation

## 2014-02-25 DIAGNOSIS — Z1151 Encounter for screening for human papillomavirus (HPV): Secondary | ICD-10-CM | POA: Insufficient documentation

## 2014-02-25 LAB — CBC WITH DIFFERENTIAL/PLATELET
Basophils Absolute: 0 10*3/uL (ref 0.0–0.1)
Basophils Relative: 0 % (ref 0–1)
EOS ABS: 0 10*3/uL (ref 0.0–0.7)
Eosinophils Relative: 1 % (ref 0–5)
HEMATOCRIT: 36.9 % (ref 36.0–46.0)
HEMOGLOBIN: 12.8 g/dL (ref 12.0–15.0)
LYMPHS ABS: 1.6 10*3/uL (ref 0.7–4.0)
LYMPHS PCT: 33 % (ref 12–46)
MCH: 28.8 pg (ref 26.0–34.0)
MCHC: 34.7 g/dL (ref 30.0–36.0)
MCV: 82.9 fL (ref 78.0–100.0)
MONO ABS: 0.3 10*3/uL (ref 0.1–1.0)
MONOS PCT: 7 % (ref 3–12)
MPV: 8.7 fL (ref 8.6–12.4)
NEUTROS ABS: 2.9 10*3/uL (ref 1.7–7.7)
Neutrophils Relative %: 59 % (ref 43–77)
PLATELETS: 238 10*3/uL (ref 150–400)
RBC: 4.45 MIL/uL (ref 3.87–5.11)
RDW: 12.5 % (ref 11.5–15.5)
WBC: 4.9 10*3/uL (ref 4.0–10.5)

## 2014-02-25 LAB — URINALYSIS W MICROSCOPIC + REFLEX CULTURE
BACTERIA UA: NONE SEEN
Bilirubin Urine: NEGATIVE
CRYSTALS: NONE SEEN
Casts: NONE SEEN
GLUCOSE, UA: NEGATIVE mg/dL
Hgb urine dipstick: NEGATIVE
Ketones, ur: NEGATIVE mg/dL
LEUKOCYTES UA: NEGATIVE
NITRITE: NEGATIVE
Protein, ur: NEGATIVE mg/dL
Specific Gravity, Urine: 1.015 (ref 1.005–1.030)
Squamous Epithelial / LPF: NONE SEEN
Urobilinogen, UA: 0.2 mg/dL (ref 0.0–1.0)
pH: 6.5 (ref 5.0–8.0)

## 2014-02-25 LAB — COMPREHENSIVE METABOLIC PANEL
ALT: 16 U/L (ref 0–35)
AST: 17 U/L (ref 0–37)
Albumin: 4.2 g/dL (ref 3.5–5.2)
Alkaline Phosphatase: 96 U/L (ref 39–117)
BUN: 14 mg/dL (ref 6–23)
CO2: 25 mEq/L (ref 19–32)
Calcium: 9.3 mg/dL (ref 8.4–10.5)
Chloride: 103 mEq/L (ref 96–112)
Creat: 0.67 mg/dL (ref 0.50–1.10)
Glucose, Bld: 84 mg/dL (ref 70–99)
Potassium: 3.7 mEq/L (ref 3.5–5.3)
Sodium: 139 mEq/L (ref 135–145)
Total Bilirubin: 0.5 mg/dL (ref 0.2–1.2)
Total Protein: 6.6 g/dL (ref 6.0–8.3)

## 2014-02-25 LAB — LIPID PANEL
CHOL/HDL RATIO: 2.7 ratio
CHOLESTEROL: 161 mg/dL (ref 0–200)
HDL: 60 mg/dL (ref >39)
LDL Cholesterol: 88 mg/dL (ref 0–99)
Triglycerides: 64 mg/dL (ref <150)
VLDL: 13 mg/dL (ref 0–40)

## 2014-02-25 NOTE — Patient Instructions (Signed)
Follow up for the ultrasound as scheduled.  You may obtain a copy of any labs that were done today by logging onto MyChart as outlined in the instructions provided with your AVS (after visit summary). The office will not call with normal lab results but certainly if there are any significant abnormalities then we will contact you.   Health Maintenance, Female A healthy lifestyle and preventative care can promote health and wellness.  Maintain regular health, dental, and eye exams.  Eat a healthy diet. Foods like vegetables, fruits, whole grains, low-fat dairy products, and lean protein foods contain the nutrients you need without too many calories. Decrease your intake of foods high in solid fats, added sugars, and salt. Get information about a proper diet from your caregiver, if necessary.  Regular physical exercise is one of the most important things you can do for your health. Most adults should get at least 150 minutes of moderate-intensity exercise (any activity that increases your heart rate and causes you to sweat) each week. In addition, most adults need muscle-strengthening exercises on 2 or more days a week.   Maintain a healthy weight. The body mass index (BMI) is a screening tool to identify possible weight problems. It provides an estimate of body fat based on height and weight. Your caregiver can help determine your BMI, and can help you achieve or maintain a healthy weight. For adults 20 years and older:  A BMI below 18.5 is considered underweight.  A BMI of 18.5 to 24.9 is normal.  A BMI of 25 to 29.9 is considered overweight.  A BMI of 30 and above is considered obese.  Maintain normal blood lipids and cholesterol by exercising and minimizing your intake of saturated fat. Eat a balanced diet with plenty of fruits and vegetables. Blood tests for lipids and cholesterol should begin at age 71 and be repeated every 5 years. If your lipid or cholesterol levels are high, you are  over 50, or you are a high risk for heart disease, you may need your cholesterol levels checked more frequently.Ongoing high lipid and cholesterol levels should be treated with medicines if diet and exercise are not effective.  If you smoke, find out from your caregiver how to quit. If you do not use tobacco, do not start.  Lung cancer screening is recommended for adults aged 43 80 years who are at high risk for developing lung cancer because of a history of smoking. Yearly low-dose computed tomography (CT) is recommended for people who have at least a 30-pack-year history of smoking and are a current smoker or have quit within the past 15 years. A pack year of smoking is smoking an average of 1 pack of cigarettes a day for 1 year (for example: 1 pack a day for 30 years or 2 packs a day for 15 years). Yearly screening should continue until the smoker has stopped smoking for at least 15 years. Yearly screening should also be stopped for people who develop a health problem that would prevent them from having lung cancer treatment.  If you are pregnant, do not drink alcohol. If you are breastfeeding, be very cautious about drinking alcohol. If you are not pregnant and choose to drink alcohol, do not exceed 1 drink per day. One drink is considered to be 12 ounces (355 mL) of beer, 5 ounces (148 mL) of wine, or 1.5 ounces (44 mL) of liquor.  Avoid use of street drugs. Do not share needles with anyone. Ask for help  if you need support or instructions about stopping the use of drugs.  High blood pressure causes heart disease and increases the risk of stroke. Blood pressure should be checked at least every 1 to 2 years. Ongoing high blood pressure should be treated with medicines, if weight loss and exercise are not effective.  If you are 55 to 43 years old, ask your caregiver if you should take aspirin to prevent strokes.  Diabetes screening involves taking a blood sample to check your fasting blood sugar level.  This should be done once every 3 years, after age 45, if you are within normal weight and without risk factors for diabetes. Testing should be considered at a younger age or be carried out more frequently if you are overweight and have at least 1 risk factor for diabetes.  Breast cancer screening is essential preventative care for women. You should practice "breast self-awareness." This means understanding the normal appearance and feel of your breasts and may include breast self-examination. Any changes detected, no matter how small, should be reported to a caregiver. Women in their 20s and 30s should have a clinical breast exam (CBE) by a caregiver as part of a regular health exam every 1 to 3 years. After age 40, women should have a CBE every year. Starting at age 40, women should consider having a mammogram (breast X-ray) every year. Women who have a family history of breast cancer should talk to their caregiver about genetic screening. Women at a high risk of breast cancer should talk to their caregiver about having an MRI and a mammogram every year.  Breast cancer gene (BRCA)-related cancer risk assessment is recommended for women who have family members with BRCA-related cancers. BRCA-related cancers include breast, ovarian, tubal, and peritoneal cancers. Having family members with these cancers may be associated with an increased risk for harmful changes (mutations) in the breast cancer genes BRCA1 and BRCA2. Results of the assessment will determine the need for genetic counseling and BRCA1 and BRCA2 testing.  The Pap test is a screening test for cervical cancer. Women should have a Pap test starting at age 21. Between ages 21 and 29, Pap tests should be repeated every 2 years. Beginning at age 30, you should have a Pap test every 3 years as long as the past 3 Pap tests have been normal. If you had a hysterectomy for a problem that was not cancer or a condition that could lead to cancer, then you no  longer need Pap tests. If you are between ages 65 and 70, and you have had normal Pap tests going back 10 years, you no longer need Pap tests. If you have had past treatment for cervical cancer or a condition that could lead to cancer, you need Pap tests and screening for cancer for at least 20 years after your treatment. If Pap tests have been discontinued, risk factors (such as a new sexual partner) need to be reassessed to determine if screening should be resumed. Some women have medical problems that increase the chance of getting cervical cancer. In these cases, your caregiver may recommend more frequent screening and Pap tests.  The human papillomavirus (HPV) test is an additional test that may be used for cervical cancer screening. The HPV test looks for the virus that can cause the cell changes on the cervix. The cells collected during the Pap test can be tested for HPV. The HPV test could be used to screen women aged 30 years and older, and   should be used in women of any age who have unclear Pap test results. After the age of 7, women should have HPV testing at the same frequency as a Pap test.  Colorectal cancer can be detected and often prevented. Most routine colorectal cancer screening begins at the age of 20 and continues through age 36. However, your caregiver may recommend screening at an earlier age if you have risk factors for colon cancer. On a yearly basis, your caregiver may provide home test kits to check for hidden blood in the stool. Use of a small camera at the end of a tube, to directly examine the colon (sigmoidoscopy or colonoscopy), can detect the earliest forms of colorectal cancer. Talk to your caregiver about this at age 46, when routine screening begins. Direct examination of the colon should be repeated every 5 to 10 years through age 62, unless early forms of pre-cancerous polyps or small growths are found.  Hepatitis C blood testing is recommended for all people born  from [birth year] through 1965 and any individual with known risks for hepatitis C.  Practice safe sex. Use condoms and avoid high-risk sexual practices to reduce the spread of sexually transmitted infections (STIs). Sexually active women aged 55 and younger should be checked for Chlamydia, which is a common sexually transmitted infection. Older women with new or multiple partners should also be tested for Chlamydia. Testing for other STIs is recommended if you are sexually active and at increased risk.  Osteoporosis is a disease in which the bones lose minerals and strength with aging. This can result in serious bone fractures. The risk of osteoporosis can be identified using a bone density scan. Women ages 31 and over and women at risk for fractures or osteoporosis should discuss screening with their caregivers. Ask your caregiver whether you should be taking a calcium supplement or vitamin D to reduce the rate of osteoporosis.  Menopause can be associated with physical symptoms and risks. Hormone replacement therapy is available to decrease symptoms and risks. You should talk to your caregiver about whether hormone replacement therapy is right for you.  Use sunscreen. Apply sunscreen liberally and repeatedly throughout the day. You should seek shade when your shadow is shorter than you. Protect yourself by wearing long sleeves, pants, a wide-brimmed hat, and sunglasses year round, whenever you are outdoors.  Notify your caregiver of new moles or changes in moles, especially if there is a change in shape or color. Also notify your caregiver if a mole is larger than the size of a pencil eraser.  Stay current with your immunizations. Document Released: 07/25/2010 Document Revised: 05/06/2012 Document Reviewed: 07/25/2010 [facility] Patient Information 2014 [place].

## 2014-02-25 NOTE — Addendum Note (Signed)
Addended by: Nelva Nay on: 02/25/2014 08:41 AM   Modules accepted: Orders

## 2014-02-25 NOTE — Progress Notes (Signed)
Lisa Wang 05-15-1970 [id number]        43 y.o.  G2P2 for annual exam.  Several issues noted below.  Past medical history,surgical history, problem list, medications, allergies, family history and social history were all reviewed and documented as reviewed in the EPIC chart.  ROS:  Performed with pertinent positives and negatives included in the history, assessment and plan.   Additional significant findings :  none   Exam: Kim Counsellor Vitals:   02/25/14 0803  BP: 112/66  Height: 5\' 5"  (1.651 m)  Weight: 140 lb (63.504 kg)   General appearance:  Normal affect, orientation and appearance. Skin: Grossly normal HEENT: Without gross lesions.  No cervical or supraclavicular adenopathy. Thyroid normal.  Lungs:  Clear without wheezing, rales or rhonchi Cardiac: RR, without RMG Abdominal:  Soft, nontender, without masses, guarding, rebound, organomegaly or hernia Breasts:  Examined lying and sitting without masses, retractions, discharge or axillary adenopathy. Pelvic:  Ext/BUS/vagina normal with slight staining  Cervix normal. Pap/HPV  Uterus anteverted, normal size, shape and contour, midline and mobile nontender   Adnexa  Without masses or tenderness    Anus and perineum  Normal   Rectovaginal  Normal sphincter tone without palpated masses or tenderness.    Assessment/Plan:  44 y.o. G2P2 female for annual exam with light monthly menses, tubal sterilization.   1. History of NovaSure endometrial ablation 2002. Was amenorrheic until this past year when she started having light monthly menses. Lasting one week. Is having significant dysmenorrhea with these. Asked patient to schedule sonohysterogram week after her menses to assess for hematometria as well as cavity assessment. Options for management include observation, hormonal suppression, attempted Mirena IUD, repeat ablation and hysterectomy all reviewed. Will further discuss after ultrasound. 2. Pap smear 2013. Pap/HPV  today. History of HGSIL 2008 with LEEP.  Normal Pap smears afterwards. 3. History of postcoital UTIs. Uses Macrodantin postcoitally with good results. Has supply at home. Will call when she needs a refill. 4. History of vulvar lesion excisions 08/2013. Pathology showed a hidradenoma and an area of squamous hyperplasia. No reported recurrence or any other lesions noted. Exam is normal. Continue with self vulvar exams and report any abnormalities. 5. Mammography 05/2013. Continue with annual mammography. SBE monthly reviewed. 6. Health maintenance. Baseline CBC comprehensive metabolic panel lipid profile urinalysis ordered. Follow up for sonohysterogram as scheduled.     Anastasio Auerbach MD, 8:29 AM 02/25/2014

## 2014-02-26 LAB — CYTOLOGY - PAP

## 2014-02-27 ENCOUNTER — Other Ambulatory Visit: Payer: Self-pay | Admitting: Gynecology

## 2014-02-27 DIAGNOSIS — N946 Dysmenorrhea, unspecified: Secondary | ICD-10-CM

## 2014-03-06 ENCOUNTER — Ambulatory Visit (INDEPENDENT_AMBULATORY_CARE_PROVIDER_SITE_OTHER): Payer: BLUE CROSS/BLUE SHIELD

## 2014-03-06 ENCOUNTER — Ambulatory Visit (INDEPENDENT_AMBULATORY_CARE_PROVIDER_SITE_OTHER): Payer: BLUE CROSS/BLUE SHIELD | Admitting: Gynecology

## 2014-03-06 ENCOUNTER — Encounter: Payer: Self-pay | Admitting: Gynecology

## 2014-03-06 VITALS — BP 138/80 | Ht 65.0 in | Wt 140.0 lb

## 2014-03-06 DIAGNOSIS — N7011 Chronic salpingitis: Secondary | ICD-10-CM

## 2014-03-06 DIAGNOSIS — N83202 Unspecified ovarian cyst, left side: Secondary | ICD-10-CM

## 2014-03-06 DIAGNOSIS — N83201 Unspecified ovarian cyst, right side: Secondary | ICD-10-CM

## 2014-03-06 DIAGNOSIS — D251 Intramural leiomyoma of uterus: Secondary | ICD-10-CM

## 2014-03-06 DIAGNOSIS — N8320 Unspecified ovarian cysts: Secondary | ICD-10-CM

## 2014-03-06 DIAGNOSIS — R102 Pelvic and perineal pain: Secondary | ICD-10-CM

## 2014-03-06 DIAGNOSIS — N946 Dysmenorrhea, unspecified: Secondary | ICD-10-CM

## 2014-03-06 DIAGNOSIS — N939 Abnormal uterine and vaginal bleeding, unspecified: Secondary | ICD-10-CM

## 2014-03-06 NOTE — Progress Notes (Signed)
Lisa Wang [birth year] [id number]        44 y.o.  G2P2 Presents for sonohysterogram due to history of endometrial ablation 2007, initially amenorrheic now with menses and significant dysmenorrhea.  Past medical history,surgical history, problem list, medications, allergies, family history and social history were all reviewed and documented in the EPIC chart.  Directed ROS with pertinent positives and negatives documented in the history of present illness/assessment and plan.  Exam: Lisa Wang assistant General appearance:  Normal External BUS vagina normal. Cervix normal.  Ultrasound shows uterus normal size. The largest measuring 14 mm. Endometrial echo 5.8 mm.  Serpentine avascular mass left adnexa 43 x 8 mm consistent with hydrosalpinx. Left ovary with thin-walled echo-free avascular cyst 27 x 19 x 13 mm. Right ovary with echo-free thin-walled avascular cyst 26 x 25 mm.  Cul-de-sac negative.  Sonohysterogram performed, sterile technique, easy Introduction with no distention consistent with intrauterine adhesions. Endometrial sample taken. Patient tolerated well.  Assessment/Plan:  44 y.o. G2P2 resumption of menses status post NovaSure endometrial ablation. Dysmenorrhea no evidence of hematometria.questionable small hydrosalpinx left adnexa. Bilateral simple ovarian cysts consistent with physiologic. Options for management reviewed to include hormonal manipulation, hysterectomy. Do not feel repeat ablation or IUD possible given the uterus appears scarred does not distend. Patient's not sure if she wants to proceed with hysterectomy at this point but prefers to wait and watch watch. Did recommend repeating her ultrasound in several months to follow up on the cystic changes and questionable hydrosalpinx the patient agrees to follow up for this and will schedule this.     Lisa Auerbach MD, 3:34 PM 03/06/2014

## 2014-03-06 NOTE — Patient Instructions (Signed)
Office will call you with biopsy results. Follow up for repeat ultrasound in 2-3 months.

## 2014-03-11 ENCOUNTER — Telehealth: Payer: Self-pay

## 2014-03-11 ENCOUNTER — Other Ambulatory Visit: Payer: Self-pay | Admitting: Gynecology

## 2014-03-11 NOTE — Telephone Encounter (Signed)
Yes they probably will suppress her bleeding which will help with the pain.  She could try a low dose Loestrin 1/20 equivalent daily. Main risk is slight increased risk of blood clots like stroke heart attack venous thromboses but in a low risk patient who never smoked risk is very low. If she is interested in trying then recommend prescribing 6 month course and have her follow up with me then to see how she is doing.

## 2014-03-11 NOTE — Telephone Encounter (Signed)
Patient called. Patient does want to try OC's. She asked will she be taking this continuously without placebo breaks?

## 2014-03-11 NOTE — Telephone Encounter (Signed)
Patient was informed biopsy benign. She has u/s scheduled in May.  She wanted me to ask you if you thought her starting a birth control pill might help with the spotting and cramping that she has been having.

## 2014-03-11 NOTE — Telephone Encounter (Signed)
When to have her start taking? Sunday after menses?

## 2014-03-11 NOTE — Telephone Encounter (Signed)
I would recommend doing that for now and we'll see how she does from a breakthrough bleeding standpoint.

## 2014-03-11 NOTE — Telephone Encounter (Signed)
Left message to call.

## 2014-03-12 MED ORDER — NORETHINDRONE ACET-ETHINYL EST 1-20 MG-MCG PO TABS: ORAL_TABLET | ORAL | Status: DC

## 2014-03-12 NOTE — Telephone Encounter (Signed)
Patient advised. Rx sent. 

## 2014-03-12 NOTE — Telephone Encounter (Signed)
now

## 2014-06-03 ENCOUNTER — Ambulatory Visit (INDEPENDENT_AMBULATORY_CARE_PROVIDER_SITE_OTHER): Payer: BLUE CROSS/BLUE SHIELD | Admitting: Gynecology

## 2014-06-03 ENCOUNTER — Ambulatory Visit: Payer: BLUE CROSS/BLUE SHIELD | Admitting: Gynecology

## 2014-06-03 ENCOUNTER — Ambulatory Visit (INDEPENDENT_AMBULATORY_CARE_PROVIDER_SITE_OTHER): Payer: BLUE CROSS/BLUE SHIELD

## 2014-06-03 ENCOUNTER — Other Ambulatory Visit: Payer: BLUE CROSS/BLUE SHIELD

## 2014-06-03 ENCOUNTER — Encounter: Payer: Self-pay | Admitting: Gynecology

## 2014-06-03 VITALS — BP 120/74

## 2014-06-03 DIAGNOSIS — D251 Intramural leiomyoma of uterus: Secondary | ICD-10-CM

## 2014-06-03 DIAGNOSIS — N7011 Chronic salpingitis: Secondary | ICD-10-CM | POA: Diagnosis not present

## 2014-06-03 DIAGNOSIS — N83202 Unspecified ovarian cyst, left side: Secondary | ICD-10-CM

## 2014-06-03 DIAGNOSIS — N8320 Unspecified ovarian cysts: Secondary | ICD-10-CM | POA: Diagnosis not present

## 2014-06-03 DIAGNOSIS — R102 Pelvic and perineal pain: Secondary | ICD-10-CM | POA: Diagnosis not present

## 2014-06-03 DIAGNOSIS — B373 Candidiasis of vulva and vagina: Secondary | ICD-10-CM | POA: Diagnosis not present

## 2014-06-03 DIAGNOSIS — N83201 Unspecified ovarian cyst, right side: Secondary | ICD-10-CM

## 2014-06-03 DIAGNOSIS — B3731 Acute candidiasis of vulva and vagina: Secondary | ICD-10-CM

## 2014-06-03 LAB — WET PREP FOR TRICH, YEAST, CLUE
CLUE CELLS WET PREP: NONE SEEN
TRICH WET PREP: NONE SEEN

## 2014-06-03 MED ORDER — FLUCONAZOLE 150 MG PO TABS: 150.0000 mg | ORAL_TABLET | Freq: Once | ORAL | Status: DC

## 2014-06-03 NOTE — Addendum Note (Signed)
Addended by: Nelva Nay on: 06/03/2014 02:10 PM   Modules accepted: Orders

## 2014-06-03 NOTE — Progress Notes (Signed)
Lisa Wang 1971-01-08 [id number]        44 y.o.  G2P2 presents for follow up ultrasound. Had sonohysterogram 02/2014. Serpentine avascular cystic mass left adnexa 41 x 8 mm consistent with a hydrosalpinx noted. [place] echo-free avascular left ovarian cyst 27 x 19 x 13 mm. Right ovary with thin echo-free avascular 26 x 25 mm mass. She was asked to repeat the ultrasound in follow up. She's having no pain or symptoms. She had started in the interim continuous low-dose oral contraceptives with Loestrin 1/20 equivalent and has done well with no bleeding and no dysmenorrhea. Patient does note several days of vaginal discharge and odor. Has a history of recurrent vaginitis. Has used boric acid suppositories suppression in the past.  Past medical history,surgical history, problem list, medications, allergies, family history and social history were all reviewed and documented in the EPIC chart.  Directed ROS with pertinent positives and negatives documented in the history of present illness/assessment and plan.  Exam: Kim assistant Filed Vitals:   06/03/14 1039  BP: 120/74   General appearance:  Normal Abdomen soft nontender without masses guarding rebound Pelvic external BUS vagina with white discharge. Cervix normal. Uterus grossly normal midline mobile nontender. Adnexa without masses or tenderness.  Ultrasound shows uterus grossly normal in size with 3 small myomas 27 mm, 16 mm, 12 mm. Endometrial echo 3.3 mm.  Right ovary with thin-walled echo-free follicle 25 x 22 mm. Previous septated cyst not seen. Left ovary with serpentine tubular hydrosalpinx 35 x 9 mm. Cul-de-sac negative.  Assessment/Plan:  44 y.o. G2P2 With:  1. Vaginal discharge and wet prep consistent with yeast vaginitis. Various options to include suppression with boric acid, weekly Diflucan 12 versus intermittent treatment for symptoms reviewed. Patient prefers intermittent treatment for symptoms. Diflucan 150 mg #10 provided.  Will take 1 by mouth daily 3 to hopefully eradicate her current infection and then use the remaining 7 as needed for symptoms. 2. Ultrasound as above. Small hydrosalpinx on the left. Physiologic changes on the ovaries. Having no pain or symptoms. Options to include observation versus surgery reviewed. Should prefer just to monitor. I did recommend follow up ultrasound in 6 months just to assure stability and she will schedule this in follow up for this.    Anastasio Auerbach MD, 11:13 AM 06/03/2014

## 2014-06-03 NOTE — Patient Instructions (Signed)
Take the Diflucan pills daily for 3 days and then use the remaining pills as needed for vaginal irritation symptoms  Follow up for repeat ultrasound in 6 months.

## 2014-08-16 ENCOUNTER — Other Ambulatory Visit: Payer: Self-pay | Admitting: Gynecology

## 2014-11-18 ENCOUNTER — Other Ambulatory Visit: Payer: Self-pay | Admitting: Gynecology

## 2014-11-18 DIAGNOSIS — N7011 Chronic salpingitis: Secondary | ICD-10-CM

## 2014-12-02 ENCOUNTER — Ambulatory Visit (INDEPENDENT_AMBULATORY_CARE_PROVIDER_SITE_OTHER): Payer: 59 | Admitting: Gynecology

## 2014-12-02 ENCOUNTER — Ambulatory Visit (INDEPENDENT_AMBULATORY_CARE_PROVIDER_SITE_OTHER): Payer: 59

## 2014-12-02 ENCOUNTER — Ambulatory Visit: Payer: BLUE CROSS/BLUE SHIELD | Admitting: Gynecology

## 2014-12-02 ENCOUNTER — Other Ambulatory Visit: Payer: Self-pay | Admitting: Gynecology

## 2014-12-02 ENCOUNTER — Encounter: Payer: Self-pay | Admitting: Gynecology

## 2014-12-02 ENCOUNTER — Other Ambulatory Visit: Payer: BLUE CROSS/BLUE SHIELD

## 2014-12-02 VITALS — BP 120/76

## 2014-12-02 DIAGNOSIS — D251 Intramural leiomyoma of uterus: Secondary | ICD-10-CM

## 2014-12-02 DIAGNOSIS — N831 Corpus luteum cyst of ovary, unspecified side: Secondary | ICD-10-CM

## 2014-12-02 DIAGNOSIS — N7011 Chronic salpingitis: Secondary | ICD-10-CM

## 2014-12-02 NOTE — Progress Notes (Signed)
ALISON KUBICKI December 23, 1970 [id number]        44 y.o.  G2P2 Presents for follow up ultrasound.  Prior ultrasound 05/2014 showed several small myomas and a small tubular cystic probable hydrosalpinx left adnexa 35 x 9 mm. She was recommended to repeat this at six-month interval. Also notes some intermittent vaginal itching with exercise and sweating. Does have a supply of Diflucan at home but has not taken any.  Past medical history,surgical history, problem list, medications, allergies, family history and social history were all reviewed and documented in the EPIC chart.  Directed ROS with pertinent positives and negatives documented in the history of present illness/assessment and plan.  Exam: Filed Vitals:   12/02/14 0851  BP: 120/76   General appearance:  Normal  Ultrasound shows uterus normal size. Several small myomas noted 34 mm, 11 mm, 10 mm. Endometrial echo 3.5 mm. Right left ovaries with physiologic changes. Tubular cystic left adnexal area 42 mm x 9 mm. Suggestive of small hydrosalpinx  Assessment/Plan:  44 y.o. G2P2 with probable small left hydrosalpinx. Study overall is stable from prior exam. Recommend one further ultrasound in 6 months just to verify stability. Patient comfortable with this. Recommended she take her Diflucan as prescribed for the itching. Follow up if this persists despite this.    Anastasio Auerbach MD, 9:04 AM 12/02/2014

## 2014-12-02 NOTE — Patient Instructions (Signed)
Follow up for your annual exam when due. We will plan on repeating the ultrasound in 6 months.

## 2015-02-19 ENCOUNTER — Ambulatory Visit (INDEPENDENT_AMBULATORY_CARE_PROVIDER_SITE_OTHER): Payer: 59 | Admitting: Gynecology

## 2015-02-19 ENCOUNTER — Encounter: Payer: Self-pay | Admitting: Gynecology

## 2015-02-19 ENCOUNTER — Telehealth: Payer: Self-pay

## 2015-02-19 VITALS — BP 120/74

## 2015-02-19 DIAGNOSIS — R140 Abdominal distension (gaseous): Secondary | ICD-10-CM

## 2015-02-19 DIAGNOSIS — R102 Pelvic and perineal pain: Secondary | ICD-10-CM

## 2015-02-19 LAB — URINALYSIS W MICROSCOPIC + REFLEX CULTURE
Bacteria, UA: NONE SEEN [HPF]
Bilirubin Urine: NEGATIVE
CASTS: NONE SEEN [LPF]
CRYSTALS: NONE SEEN [HPF]
Glucose, UA: NEGATIVE
Ketones, ur: NEGATIVE
Leukocytes, UA: NEGATIVE
Nitrite: NEGATIVE
PROTEIN: NEGATIVE
Specific Gravity, Urine: >1.03 (ref 1.001–1.035)
WBC, UA: NONE SEEN WBC/HPF (ref <=5)
Yeast: NONE SEEN [HPF]
pH: 5.5 (ref 5.0–8.0)

## 2015-02-19 NOTE — Telephone Encounter (Signed)
Patient said she has fibroids and you had put her on birth control pill because of bleeding. She said she is having "alot of bloating and pressure in the pelvic area".  She asked about changing her birth control pill to help with this.  What to advise?

## 2015-02-19 NOTE — Telephone Encounter (Signed)
I would recommend office visit for exam. There are a lot of reasons for bloating and pressure. She does have a history of leiomyoma and cystic changes.

## 2015-02-19 NOTE — Progress Notes (Signed)
Lisa Wang Oct 08, 1970 [id number]        45 y.o.  G2P2 patient presents complaining of a lot of lower abdominal bloating and discomfort over the last week or so. Does have a history of leiomyoma and probable left hydrosalpinx, small. Started on oral contraceptives for menstrual regulation following resumption of menses following ablation. Admits to not taking them on a regular basis.  No nausea vomiting diarrhea constipation. No frequency dysuria or urgency.  Past medical history,surgical history, problem list, medications, allergies, family history and social history were all reviewed and documented in the EPIC chart.  Directed ROS with pertinent positives and negatives documented in the history of present illness/assessment and plan.  Exam: Caryn Bee assistant Filed Vitals:   02/19/15 1523  BP: 120/74   General appearance:  Normal Abdomen soft nontender without masses guarding rebound Pelvic external BUS vagina normal. Cervix normal. Uterus bulky irregular consistent with leiomyoma. Adnexa without masses or tenderness. Rectal exam is normal.  Assessment/Plan:  45 y.o. G2P2 with bloating and discomfort area no overt GI or GU symptoms. History of leiomyoma and small left hydrosalpinx. Recent ultrasound in November. Will follow up with ultrasound now rule out expansion of her hydrosalpinx. Continue on her oral contraceptives for now. I again reviewed the options to including hysterectomy which she is not ready to move forward with. She'll follow up for her ultrasound and then will go from there.    Anastasio Auerbach MD, 3:55 PM 02/19/2015

## 2015-02-19 NOTE — Addendum Note (Signed)
Addended by: Nelva Nay on: 02/19/2015 04:04 PM   Modules accepted: Orders

## 2015-02-19 NOTE — Telephone Encounter (Signed)
Patient informed   appt scheduled

## 2015-02-19 NOTE — Patient Instructions (Signed)
Stay on the birth control pills for now. Follow up for ultrasound as scheduled.

## 2015-02-20 LAB — URINE CULTURE
Colony Count: NO GROWTH
Organism ID, Bacteria: NO GROWTH

## 2015-02-24 ENCOUNTER — Telehealth: Payer: Self-pay

## 2015-02-24 NOTE — Telephone Encounter (Signed)
Patient said she came in Friday to see you about abdominal pain/bloating. She said your exam was negative and you mentioned her scheduling an ultrasound. She said you did ask her at visit about any constipation.  She said she has noticed some problems with constipation and once she has a bowel movement the bloating and pain subsides. She said she was once treated for IBS and wonders if this might be the issue and should she even do the pelvic ultrasound. She wanted to see what you recommended she do.

## 2015-02-24 NOTE — Telephone Encounter (Signed)
Patient advised. U/s cancelled.

## 2015-02-24 NOTE — Telephone Encounter (Signed)
I think given that her pain resolves with a bowel movement that observation for now is okay. She can forget about the ultrasound unless the pain returns and persists. I would try to get on a stool softener such as FiberCon or Metamucil to see if she cannot regulate her bowel movements.

## 2015-03-01 ENCOUNTER — Ambulatory Visit: Payer: 59 | Admitting: Gynecology

## 2015-03-01 ENCOUNTER — Other Ambulatory Visit: Payer: 59

## 2015-03-18 ENCOUNTER — Encounter: Payer: 59 | Admitting: Gynecology

## 2015-04-22 ENCOUNTER — Encounter: Payer: Self-pay | Admitting: Gynecology

## 2015-04-22 ENCOUNTER — Ambulatory Visit (INDEPENDENT_AMBULATORY_CARE_PROVIDER_SITE_OTHER): Payer: 59 | Admitting: Gynecology

## 2015-04-22 VITALS — BP 130/74 | Ht 64.0 in | Wt 133.0 lb

## 2015-04-22 DIAGNOSIS — N7011 Chronic salpingitis: Secondary | ICD-10-CM

## 2015-04-22 DIAGNOSIS — Z01419 Encounter for gynecological examination (general) (routine) without abnormal findings: Secondary | ICD-10-CM | POA: Diagnosis not present

## 2015-04-22 DIAGNOSIS — D251 Intramural leiomyoma of uterus: Secondary | ICD-10-CM | POA: Diagnosis not present

## 2015-04-22 DIAGNOSIS — Z13220 Encounter for screening for lipoid disorders: Secondary | ICD-10-CM

## 2015-04-22 LAB — COMPREHENSIVE METABOLIC PANEL
ALBUMIN: 4 g/dL (ref 3.6–5.1)
ALT: 14 U/L (ref 6–29)
AST: 17 U/L (ref 10–30)
Alkaline Phosphatase: 87 U/L (ref 33–115)
BUN: 9 mg/dL (ref 7–25)
CHLORIDE: 104 mmol/L (ref 98–110)
CO2: 27 mmol/L (ref 20–31)
CREATININE: 0.77 mg/dL (ref 0.50–1.10)
Calcium: 9.3 mg/dL (ref 8.6–10.2)
Glucose, Bld: 86 mg/dL (ref 65–99)
POTASSIUM: 3.7 mmol/L (ref 3.5–5.3)
SODIUM: 139 mmol/L (ref 135–146)
Total Bilirubin: 0.4 mg/dL (ref 0.2–1.2)
Total Protein: 6.5 g/dL (ref 6.1–8.1)

## 2015-04-22 LAB — CBC WITH DIFFERENTIAL/PLATELET
BASOS PCT: 0 % (ref 0–1)
Basophils Absolute: 0 10*3/uL (ref 0.0–0.1)
EOS PCT: 1 % (ref 0–5)
Eosinophils Absolute: 0 10*3/uL (ref 0.0–0.7)
HCT: 36.3 % (ref 36.0–46.0)
Hemoglobin: 12.4 g/dL (ref 12.0–15.0)
LYMPHS PCT: 39 % (ref 12–46)
Lymphs Abs: 1.8 10*3/uL (ref 0.7–4.0)
MCH: 28.6 pg (ref 26.0–34.0)
MCHC: 34.2 g/dL (ref 30.0–36.0)
MCV: 83.8 fL (ref 78.0–100.0)
MONO ABS: 0.3 10*3/uL (ref 0.1–1.0)
MPV: 9 fL (ref 8.6–12.4)
Monocytes Relative: 6 % (ref 3–12)
NEUTROS ABS: 2.4 10*3/uL (ref 1.7–7.7)
Neutrophils Relative %: 54 % (ref 43–77)
PLATELETS: 260 10*3/uL (ref 150–400)
RBC: 4.33 MIL/uL (ref 3.87–5.11)
RDW: 12.8 % (ref 11.5–15.5)
WBC: 4.5 10*3/uL (ref 4.0–10.5)

## 2015-04-22 LAB — LIPID PANEL
CHOL/HDL RATIO: 2.4 ratio (ref <=5.0)
Cholesterol: 167 mg/dL (ref 125–200)
HDL: 70 mg/dL (ref >=46)
LDL CALC: 89 mg/dL (ref <130)
TRIGLYCERIDES: 41 mg/dL (ref <150)
VLDL: 8 mg/dL (ref <30)

## 2015-04-22 MED ORDER — NITROFURANTOIN MONOHYD MACRO 100 MG PO CAPS: 100.0000 mg | ORAL_CAPSULE | Freq: Once | ORAL | Status: DC

## 2015-04-22 MED ORDER — NORETHIN ACE-ETH ESTRAD-FE 1-20 MG-MCG PO TABS: ORAL_TABLET | ORAL | Status: DC

## 2015-04-22 MED ORDER — FLUCONAZOLE 150 MG PO TABS: 150.0000 mg | ORAL_TABLET | Freq: Once | ORAL | Status: DC

## 2015-04-22 NOTE — Progress Notes (Signed)
    Lisa Wang 09-Dec-1970 [id number]        44 y.o.  G2P2  for annual exam.  Doing well. Several issues noted below.  Past medical history,surgical history, problem list, medications, allergies, family history and social history were all reviewed and documented as reviewed in the EPIC chart.  ROS:  Performed with pertinent positives and negatives included in the history, assessment and plan.   Additional significant findings :  none   Exam: Caryn Bee assistant Filed Vitals:   04/22/15 0819  BP: 130/74  Height: 5\' 4"  (1.626 m)  Weight: 133 lb (60.328 kg)   General appearance:  Normal affect, orientation and appearance. Skin: Grossly normal HEENT: Without gross lesions.  No cervical or supraclavicular adenopathy. Thyroid normal.  Lungs:  Clear without wheezing, rales or rhonchi Cardiac: RR, without RMG Abdominal:  Soft, nontender, without masses, guarding, rebound, organomegaly or hernia Breasts:  Examined lying and sitting without masses, retractions, discharge or axillary adenopathy. Pelvic:  Ext/BUS/vagina normal  Cervix normal  Uterus anteverted, grossly normal in size, midline and mobile nontender   Adnexa without masses or tenderness    Anus and perineum normal   Rectovaginal normal sphincter tone without palpated masses or tenderness.    Assessment/Plan:  45 y.o. G2P2 female for annual exam with mildly irregular menses, tubal sterilization..   1. Dysmenorrhea. Using 1/20 equivalent BCPs for menstrual regulation and suppression of her dysmenorrhea.  History of NovaSure endometrial ablation 2007 with initial amenorrhea, subsequent light menses but with significant cramping leading to starting of her BCPs for suppression.  Doing well with this and wants to continue. Refill 1 year provided. We have discussed the risks to include thrombosis such as stroke heart attack DVT. Not being followed for any medical issues and never smoked. 2. History of abdominal bloating in  January. Was to schedule ultrasound but canceled it because her bloating resolved after treatment for IBS by her primary physician. 3. History of left hydrosalpinx/leiomyomaareas was to repeat ultrasound at 6 months from her 11/2014 ultrasound.  Will schedule and follow up for this. 4. Pap smear/HPV 02/2014 negative. No Pap smear done today.  History of CIN-2 2008. Normal Pap smears since then. 5. Mammography 2015. Recommended patient schedule screening mammography now when she agrees to do so. SBE monthly reviewed. 6. History of postcoital UTIs. Uses Macrobid 100 mg after intercourse with good suppression. #30 with 3 refills provided. 7. History of recurrent yeast treated when necessary with Diflucan 150 mg. #5 with 1 refill provided. 8. Health maintenance. Baseline CBC, CMP, lipid profile, urinalysis ordered. Follow up for ultrasound as scheduled otherwise annual exam in one year.   Anastasio Auerbach MD, 8:44 AM 04/22/2015

## 2015-04-22 NOTE — Patient Instructions (Addendum)
Schedule your mammogram  Follow up for ultrasound as scheduled by the office in May  You may obtain a copy of any labs that were done today by logging onto MyChart as outlined in the instructions provided with your AVS (after visit summary). The office will not call with normal lab results but certainly if there are any significant abnormalities then we will contact you.   Health Maintenance Adopting a healthy lifestyle and getting preventive care can go a long way to promote health and wellness. Talk with your health care provider about what schedule of regular examinations is right for you. This is a good chance for you to check in with your provider about disease prevention and staying healthy. In between checkups, there are plenty of things you can do on your own. Experts have done a lot of research about which lifestyle changes and preventive measures are most likely to keep you healthy. Ask your health care provider for more information. WEIGHT AND DIET  Eat a healthy diet  Be sure to include plenty of vegetables, fruits, low-fat dairy products, and lean protein.  Do not eat a lot of foods high in solid fats, added sugars, or salt.  Get regular exercise. This is one of the most important things you can do for your health.  Most adults should exercise for at least 150 minutes each week. The exercise should increase your heart rate and make you sweat (moderate-intensity exercise).  Most adults should also do strengthening exercises at least twice a week. This is in addition to the moderate-intensity exercise.  Maintain a healthy weight  Body mass index (BMI) is a measurement that can be used to identify possible weight problems. It estimates body fat based on height and weight. Your health care provider can help determine your BMI and help you achieve or maintain a healthy weight.  For females 76 years of age and older:   A BMI below 18.5 is considered underweight.  A BMI of 18.5  to 24.9 is normal.  A BMI of 25 to 29.9 is considered overweight.  A BMI of 30 and above is considered obese.  Watch levels of cholesterol and blood lipids  You should start having your blood tested for lipids and cholesterol at 44 years of age, then have this test every 5 years.  You may need to have your cholesterol levels checked more often if:  Your lipid or cholesterol levels are high.  You are older than 44 years of age.  You are at high risk for heart disease.  CANCER SCREENING   Lung Cancer  Lung cancer screening is recommended for adults 64-43 years old who are at high risk for lung cancer because of a history of smoking.  A yearly low-dose CT scan of the lungs is recommended for people who:  Currently smoke.  Have quit within the past 15 years.  Have at least a 30-pack-year history of smoking. A pack year is smoking an average of one pack of cigarettes a day for 1 year.  Yearly screening should continue until it has been 15 years since you quit.  Yearly screening should stop if you develop a health problem that would prevent you from having lung cancer treatment.  Breast Cancer  Practice breast self-awareness. This means understanding how your breasts normally appear and feel.  It also means doing regular breast self-exams. Let your health care provider know about any changes, no matter how small.  If you are in your 12s or  40s, you should have a clinical breast exam (CBE) by a health care provider every 1-3 years as part of a regular health exam.  If you are 31 or older, have a CBE every year. Also consider having a breast X-ray (mammogram) every year.  If you have a family history of breast cancer, talk to your health care provider about genetic screening.  If you are at high risk for breast cancer, talk to your health care provider about having an MRI and a mammogram every year.  Breast cancer gene (BRCA) assessment is recommended for women who have  family members with BRCA-related cancers. BRCA-related cancers include:  Breast.  Ovarian.  Tubal.  Peritoneal cancers.  Results of the assessment will determine the need for genetic counseling and BRCA1 and BRCA2 testing. Cervical Cancer Routine pelvic examinations to screen for cervical cancer are no longer recommended for nonpregnant women who are considered low risk for cancer of the pelvic organs (ovaries, uterus, and vagina) and who do not have symptoms. A pelvic examination may be necessary if you have symptoms including those associated with pelvic infections. Ask your health care provider if a screening pelvic exam is right for you.   The Pap test is the screening test for cervical cancer for women who are considered at risk.  If you had a hysterectomy for a problem that was not cancer or a condition that could lead to cancer, then you no longer need Pap tests.  If you are older than 65 years, and you have had normal Pap tests for the past 10 years, you no longer need to have Pap tests.  If you have had past treatment for cervical cancer or a condition that could lead to cancer, you need Pap tests and screening for cancer for at least 20 years after your treatment.  If you no longer get a Pap test, assess your risk factors if they change (such as having a new sexual partner). This can affect whether you should start being screened again.  Some women have medical problems that increase their chance of getting cervical cancer. If this is the case for you, your health care provider may recommend more frequent screening and Pap tests.  The human papillomavirus (HPV) test is another test that may be used for cervical cancer screening. The HPV test looks for the virus that can cause cell changes in the cervix. The cells collected during the Pap test can be tested for HPV.  The HPV test can be used to screen women 76 years of age and older. Getting tested for HPV can extend the interval  between normal Pap tests from three to five years.  An HPV test also should be used to screen women of any age who have unclear Pap test results.  After 44 years of age, women should have HPV testing as often as Pap tests.  Colorectal Cancer  This type of cancer can be detected and often prevented.  Routine colorectal cancer screening usually begins at 44 years of age and continues through 44 years of age.  Your health care provider may recommend screening at an earlier age if you have risk factors for colon cancer.  Your health care provider may also recommend using home test kits to check for hidden blood in the stool.  A small camera at the end of a tube can be used to examine your colon directly (sigmoidoscopy or colonoscopy). This is done to check for the earliest forms of colorectal cancer.  Routine screening usually begins at age 63.  Direct examination of the colon should be repeated every 5-10 years through 44 years of age. However, you may need to be screened more often if early forms of precancerous polyps or small growths are found. Skin Cancer  Check your skin from head to toe regularly.  Tell your health care provider about any new moles or changes in moles, especially if there is a change in a mole's shape or color.  Also tell your health care provider if you have a mole that is larger than the size of a pencil eraser.  Always use sunscreen. Apply sunscreen liberally and repeatedly throughout the day.  Protect yourself by wearing long sleeves, pants, a wide-brimmed hat, and sunglasses whenever you are outside. HEART DISEASE, DIABETES, AND HIGH BLOOD PRESSURE   Have your blood pressure checked at least every 1-2 years. High blood pressure causes heart disease and increases the risk of stroke.  If you are between 19 years and 40 years old, ask your health care provider if you should take aspirin to prevent strokes.  Have regular diabetes screenings. This involves  taking a blood sample to check your fasting blood sugar level.  If you are at a normal weight and have a low risk for diabetes, have this test once every three years after 44 years of age.  If you are overweight and have a high risk for diabetes, consider being tested at a younger age or more often. PREVENTING INFECTION  Hepatitis B  If you have a higher risk for hepatitis B, you should be screened for this virus. You are considered at high risk for hepatitis B if:  You were born in a country where hepatitis B is common. Ask your health care provider which countries are considered high risk.  Your parents were born in a high-risk country, and you have not been immunized against hepatitis B (hepatitis B vaccine).  You have HIV or AIDS.  You use needles to inject street drugs.  You live with someone who has hepatitis B.  You have had sex with someone who has hepatitis B.  You get hemodialysis treatment.  You take certain medicines for conditions, including cancer, organ transplantation, and autoimmune conditions. Hepatitis C  Blood testing is recommended for:  Everyone born from [birth year] through 1964.  Anyone with known risk factors for hepatitis C. Sexually transmitted infections (STIs)  You should be screened for sexually transmitted infections (STIs) including gonorrhea and chlamydia if:  You are sexually active and are younger than 44 years of age.  You are older than 44 years of age and your health care provider tells you that you are at risk for this type of infection.  Your sexual activity has changed since you were last screened and you are at an increased risk for chlamydia or gonorrhea. Ask your health care provider if you are at risk.  If you do not have HIV, but are at risk, it may be recommended that you take a prescription medicine daily to prevent HIV infection. This is called pre-exposure prophylaxis (PrEP). You are considered at risk if:  You are sexually active  and do not regularly use condoms or know the HIV status of your partner(s).  You take drugs by injection.  You are sexually active with a partner who has HIV. Talk with your health care provider about whether you are at high risk of being infected with HIV. If you choose to begin PrEP, you should first  be tested for HIV. You should then be tested every 3 months for as long as you are taking PrEP.  PREGNANCY   If you are premenopausal and you may become pregnant, ask your health care provider about preconception counseling.  If you may become pregnant, take 400 to 800 micrograms (mcg) of folic acid every day.  If you want to prevent pregnancy, talk to your health care provider about birth control (contraception). OSTEOPOROSIS AND MENOPAUSE   Osteoporosis is a disease in which the bones lose minerals and strength with aging. This can result in serious bone fractures. Your risk for osteoporosis can be identified using a bone density scan.  If you are 6 years of age or older, or if you are at risk for osteoporosis and fractures, ask your health care provider if you should be screened.  Ask your health care provider whether you should take a calcium or vitamin D supplement to lower your risk for osteoporosis.  Menopause may have certain physical symptoms and risks.  Hormone replacement therapy may reduce some of these symptoms and risks. Talk to your health care provider about whether hormone replacement therapy is right for you.  HOME CARE INSTRUCTIONS   Schedule regular health, dental, and eye exams.  Stay current with your immunizations.   Do not use any tobacco products including cigarettes, chewing tobacco, or electronic cigarettes.  If you are pregnant, do not drink alcohol.  If you are breastfeeding, limit how much and how often you drink alcohol.  Limit alcohol intake to no more than 1 drink per day for nonpregnant women. One drink equals 12 ounces of beer, 5 ounces of wine,  or 1 ounces of hard liquor.  Do not use street drugs.  Do not share needles.  Ask your health care provider for help if you need support or information about quitting drugs.  Tell your health care provider if you often feel depressed.  Tell your health care provider if you have ever been abused or do not feel safe at home. Document Released: 07/25/2010 Document Revised: 05/26/2013 Document Reviewed: 12/11/2012 [facility] Patient Information 2015 [place], [place]. This information is not intended to replace advice given to you by your health care provider. Make sure you discuss any questions you have with your health care provider.

## 2015-04-23 LAB — URINALYSIS W MICROSCOPIC + REFLEX CULTURE
Bacteria, UA: NONE SEEN [HPF]
Bilirubin Urine: NEGATIVE
Casts: NONE SEEN [LPF]
Crystals: NONE SEEN [HPF]
Glucose, UA: NEGATIVE
HGB URINE DIPSTICK: NEGATIVE
Ketones, ur: NEGATIVE
Leukocytes, UA: NEGATIVE
Nitrite: NEGATIVE
PROTEIN: NEGATIVE
SPECIFIC GRAVITY, URINE: 1.024 (ref 1.001–1.035)
WBC UA: NONE SEEN WBC/HPF (ref <=5)
YEAST: NONE SEEN [HPF]
pH: 6 (ref 5.0–8.0)

## 2015-04-24 LAB — URINE CULTURE
Colony Count: NO GROWTH
Organism ID, Bacteria: NO GROWTH

## 2015-06-23 ENCOUNTER — Ambulatory Visit (INDEPENDENT_AMBULATORY_CARE_PROVIDER_SITE_OTHER): Payer: 59

## 2015-06-23 ENCOUNTER — Other Ambulatory Visit: Payer: Self-pay | Admitting: Gynecology

## 2015-06-23 ENCOUNTER — Encounter: Payer: Self-pay | Admitting: Gynecology

## 2015-06-23 ENCOUNTER — Ambulatory Visit (INDEPENDENT_AMBULATORY_CARE_PROVIDER_SITE_OTHER): Payer: 59 | Admitting: Gynecology

## 2015-06-23 VITALS — BP 120/70

## 2015-06-23 DIAGNOSIS — N858 Other specified noninflammatory disorders of uterus: Secondary | ICD-10-CM

## 2015-06-23 DIAGNOSIS — R140 Abdominal distension (gaseous): Secondary | ICD-10-CM | POA: Diagnosis not present

## 2015-06-23 DIAGNOSIS — N8300 Follicular cyst of ovary, unspecified side: Secondary | ICD-10-CM

## 2015-06-23 DIAGNOSIS — D251 Intramural leiomyoma of uterus: Secondary | ICD-10-CM

## 2015-06-23 NOTE — Progress Notes (Signed)
    Lisa Wang 11/21/1970 [id number]        45 y.o.  G2P2 presents for ultrasound with history of abdominal bloating which was felt to be due to IBS and has resolved She does have a past evidence of a probable hydrosalpinx on the left measuring 42 mm x 9 mm in November 2016 resents now for ultrasound to assess for stability.  Past medical history,surgical history, problem list, medications, allergies, family history and social history were all reviewed and documented in the EPIC chart.  Directed ROS with pertinent positives and negatives documented in the history of present illness/assessment and plan.  Exam  Filed Vitals:   06/23/15 0903  BP: 120/70   General appearance:  Normal   Ultrasound shows uterus normal size and echotexture.Several small myomas 28 mm, 19 mm noted. Endometrial echo 3 mm. Small amount of fluid within her C-section scar 15 x 7 mm. Right ovary normal with small follicle 18 mm. Left ovary normal with prior tubular structure decreased in size 19 mm x 12 mm x 15 mm.Cul-de-sac negative.  Assessment/Plan:  45 y.o. G2P2 with stable small myomas on ultrasound and decreased in size small probable left hydrosalpinx. Patient doing well without symptoms. Plan expectant management with follow up exam next year when due for annual exam. Sooner if any issues.    Anastasio Auerbach MD, 9:21 AM 06/23/2015

## 2015-06-23 NOTE — Patient Instructions (Signed)
Follow-up routinely when due for annual exam 

## 2015-11-02 ENCOUNTER — Encounter: Payer: Self-pay | Admitting: Women's Health

## 2015-11-02 ENCOUNTER — Ambulatory Visit (INDEPENDENT_AMBULATORY_CARE_PROVIDER_SITE_OTHER): Payer: 59 | Admitting: Women's Health

## 2015-11-02 VITALS — BP 118/80 | Ht 64.0 in | Wt 133.0 lb

## 2015-11-02 DIAGNOSIS — N898 Other specified noninflammatory disorders of vagina: Secondary | ICD-10-CM

## 2015-11-02 LAB — WET PREP FOR TRICH, YEAST, CLUE
CLUE CELLS WET PREP: NONE SEEN
Trich, Wet Prep: NONE SEEN
Yeast Wet Prep HPF POC: NONE SEEN

## 2015-11-02 NOTE — Progress Notes (Signed)
Presents with complaint of white discharge without itching or odor but a change for several days. History of ablation and takes OCs continuously- amenorrheic. Same partner. Also reports several hair bumps that she relates to shaving. Denies abdominal pain, fever, urinary symptoms.  Exam: Appears well. External genitalia 2 areas of folliculitis, not indurated. Speculum exam scant white discharge without erythema, odor noted. Wet prep negative.  Normal vaginal discharge  Plan: Reviewed normality of exam and wet prep, reassurance given. Instructed to put Neosporin on 2 areas of folliculitis, prevention reviewed, encourage shaving cream and only shaving down to help prevent. Instructed to call if continued problems.

## 2016-04-27 ENCOUNTER — Encounter: Payer: Self-pay | Admitting: Gynecology

## 2016-04-27 ENCOUNTER — Ambulatory Visit (INDEPENDENT_AMBULATORY_CARE_PROVIDER_SITE_OTHER): Payer: 59 | Admitting: Gynecology

## 2016-04-27 VITALS — BP 112/70 | Ht 64.0 in | Wt 133.0 lb

## 2016-04-27 DIAGNOSIS — Z01419 Encounter for gynecological examination (general) (routine) without abnormal findings: Secondary | ICD-10-CM

## 2016-04-27 LAB — COMPREHENSIVE METABOLIC PANEL
ALBUMIN: 4.1 g/dL (ref 3.6–5.1)
ALK PHOS: 102 U/L (ref 33–115)
ALT: 16 U/L (ref 6–29)
AST: 17 U/L (ref 10–30)
BUN: 9 mg/dL (ref 7–25)
CALCIUM: 9.1 mg/dL (ref 8.6–10.2)
CO2: 27 mmol/L (ref 20–31)
Chloride: 106 mmol/L (ref 98–110)
Creat: 0.77 mg/dL (ref 0.50–1.10)
Glucose, Bld: 81 mg/dL (ref 65–99)
POTASSIUM: 3.9 mmol/L (ref 3.5–5.3)
SODIUM: 140 mmol/L (ref 135–146)
TOTAL PROTEIN: 6.4 g/dL (ref 6.1–8.1)
Total Bilirubin: 0.5 mg/dL (ref 0.2–1.2)

## 2016-04-27 LAB — URINALYSIS W MICROSCOPIC + REFLEX CULTURE
BILIRUBIN URINE: NEGATIVE
Bacteria, UA: NONE SEEN [HPF]
Casts: NONE SEEN [LPF]
GLUCOSE, UA: NEGATIVE
Hgb urine dipstick: NEGATIVE
Ketones, ur: NEGATIVE
LEUKOCYTES UA: NEGATIVE
Nitrite: NEGATIVE
Protein, ur: NEGATIVE
RBC / HPF: NONE SEEN RBC/HPF (ref <=2)
SPECIFIC GRAVITY, URINE: 1.022 (ref 1.001–1.035)
Squamous Epithelial / LPF: NONE SEEN [HPF] (ref <=5)
WBC UA: NONE SEEN WBC/HPF (ref <=5)
Yeast: NONE SEEN [HPF]
pH: 6 (ref 5.0–8.0)

## 2016-04-27 LAB — CBC WITH DIFFERENTIAL/PLATELET
BASOS PCT: 0 %
Basophils Absolute: 0 cells/uL (ref 0–200)
EOS PCT: 1 %
Eosinophils Absolute: 60 cells/uL (ref 15–500)
HCT: 37.4 % (ref 35.0–45.0)
Hemoglobin: 12.8 g/dL (ref 11.7–15.5)
Lymphocytes Relative: 34 %
Lymphs Abs: 2040 cells/uL (ref 850–3900)
MCH: 29.4 pg (ref 27.0–33.0)
MCHC: 34.2 g/dL (ref 32.0–36.0)
MCV: 85.8 fL (ref 80.0–100.0)
MONOS PCT: 8 %
MPV: 9 fL (ref 7.5–12.5)
Monocytes Absolute: 480 cells/uL (ref 200–950)
NEUTROS ABS: 3420 {cells}/uL (ref 1500–7800)
Neutrophils Relative %: 57 %
PLATELETS: 268 10*3/uL (ref 140–400)
RBC: 4.36 MIL/uL (ref 3.80–5.10)
RDW: 12.7 % (ref 11.0–15.0)
WBC: 6 10*3/uL (ref 3.8–10.8)

## 2016-04-27 MED ORDER — NITROFURANTOIN MONOHYD MACRO 100 MG PO CAPS: 100.0000 mg | ORAL_CAPSULE | Freq: Once | ORAL | 3 refills | Status: AC

## 2016-04-27 MED ORDER — NORETHIN ACE-ETH ESTRAD-FE 1-20 MG-MCG PO TABS: ORAL_TABLET | ORAL | 12 refills | Status: DC

## 2016-04-27 NOTE — Patient Instructions (Addendum)
Call to Schedule your mammogram  Facilities in [place]: 1)  [facility]. [facility], [address] Phone: [phone] 2)  Dr. Bertrand at [facility]  [address] Phone: [phone]     Mammogram A mammogram is an X-ray test to find changes in a woman's breast. You should get a mammogram if:  You are 45 years of age or older  You have risk factors.   Your doctor recommends that you have one.  BEFORE THE TEST  Do not schedule the test the week before your period, especially if your breasts are sore during this time.  On the day of your mammogram:  Wash your breasts and armpits well. After washing, do not put on any deodorant or talcum powder on until after your test.   Eat and drink as you usually do.   Take your medicines as usual.   If you are diabetic and take insulin, make sure you:   Eat before coming for your test.   Take your insulin as usual.   If you cannot keep your appointment, call before the appointment to cancel. Schedule another appointment.  TEST  You will need to undress from the waist up. You will put on a hospital gown.   Your breast will be put on the mammogram machine, and it will press firmly on your breast with a piece of plastic called a compression paddle. This will make your breast flatter so that the machine can X-ray all parts of your breast.   Both breasts will be X-rayed. Each breast will be X-rayed from above and from the side. An X-ray might need to be taken again if the picture is not good enough.   The mammogram will last about 15 to 30 minutes.  AFTER THE TEST Finding out the results of your test Ask when your test results will be ready. Make sure you get your test results.  Document Released: 04/07/2008 Document Revised: 12/29/2010 Document Reviewed: 04/07/2008 ExitCare Patient Information 2012 [employer].  You may obtain a copy of any labs that were  done today by logging onto MyChart as outlined in the instructions provided with your AVS (after visit summary). The office will not call with normal lab results but certainly if there are any significant abnormalities then we will contact you.   Health Maintenance Adopting a healthy lifestyle and getting preventive care can go a long way to promote health and wellness. Talk with your health care provider about what schedule of regular examinations is right for you. This is a good chance for you to check in with your provider about disease prevention and staying healthy. In between checkups, there are plenty of things you can do on your own. Experts have done a lot of research about which lifestyle changes and preventive measures are most likely to keep you healthy. Ask your health care provider for more information. WEIGHT AND DIET  Eat a healthy diet  Be sure to include plenty of vegetables, fruits, low-fat dairy products, and lean protein.  Do not eat a lot of foods high in solid fats, added sugars, or salt.  Get regular exercise. This is one of the most important things you can do for your health.  Most adults should exercise for at least 150 minutes each week. The exercise should increase your heart rate and make you sweat (moderate-intensity exercise).  Most adults should also do strengthening exercises at least twice a   week. This is in addition to the moderate-intensity exercise.  Maintain a healthy weight  Body mass index (BMI) is a measurement that can be used to identify possible weight problems. It estimates body fat based on height and weight. Your health care provider can help determine your BMI and help you achieve or maintain a healthy weight.  For females 20 years of age and older:   A BMI below 18.5 is considered underweight.  A BMI of 18.5 to 24.9 is normal.  A BMI of 25 to 29.9 is considered overweight.  A BMI of 30 and above is considered obese.  Watch levels of  cholesterol and blood lipids  You should start having your blood tested for lipids and cholesterol at 45 years of age, then have this test every 5 years.  You may need to have your cholesterol levels checked more often if:  Your lipid or cholesterol levels are high.  You are older than 45 years of age.  You are at high risk for heart disease.  CANCER SCREENING   Lung Cancer  Lung cancer screening is recommended for adults 55-80 years old who are at high risk for lung cancer because of a history of smoking.  A yearly low-dose CT scan of the lungs is recommended for people who:  Currently smoke.  Have quit within the past 15 years.  Have at least a 30-pack-year history of smoking. A pack year is smoking an average of one pack of cigarettes a day for 1 year.  Yearly screening should continue until it has been 15 years since you quit.  Yearly screening should stop if you develop a health problem that would prevent you from having lung cancer treatment.  Breast Cancer  Practice breast self-awareness. This means understanding how your breasts normally appear and feel.  It also means doing regular breast self-exams. Let your health care provider know about any changes, no matter how small.  If you are in your 20s or 30s, you should have a clinical breast exam (CBE) by a health care provider every 1-3 years as part of a regular health exam.  If you are 40 or older, have a CBE every year. Also consider having a breast X-ray (mammogram) every year.  If you have a family history of breast cancer, talk to your health care provider about genetic screening.  If you are at high risk for breast cancer, talk to your health care provider about having an MRI and a mammogram every year.  Breast cancer gene (BRCA) assessment is recommended for women who have family members with BRCA-related cancers. BRCA-related cancers include:  Breast.  Ovarian.  Tubal.  Peritoneal  cancers.  Results of the assessment will determine the need for genetic counseling and BRCA1 and BRCA2 testing. Cervical Cancer Routine pelvic examinations to screen for cervical cancer are no longer recommended for nonpregnant women who are considered low risk for cancer of the pelvic organs (ovaries, uterus, and vagina) and who do not have symptoms. A pelvic examination may be necessary if you have symptoms including those associated with pelvic infections. Ask your health care provider if a screening pelvic exam is right for you.   The Pap test is the screening test for cervical cancer for women who are considered at risk.  If you had a hysterectomy for a problem that was not cancer or a condition that could lead to cancer, then you no longer need Pap tests.  If you are older than 65 years, and   you have had normal Pap tests for the past 10 years, you no longer need to have Pap tests.  If you have had past treatment for cervical cancer or a condition that could lead to cancer, you need Pap tests and screening for cancer for at least 20 years after your treatment.  If you no longer get a Pap test, assess your risk factors if they change (such as having a new sexual partner). This can affect whether you should start being screened again.  Some women have medical problems that increase their chance of getting cervical cancer. If this is the case for you, your health care provider may recommend more frequent screening and Pap tests.  The human papillomavirus (HPV) test is another test that may be used for cervical cancer screening. The HPV test looks for the virus that can cause cell changes in the cervix. The cells collected during the Pap test can be tested for HPV.  The HPV test can be used to screen women 30 years of age and older. Getting tested for HPV can extend the interval between normal Pap tests from three to five years.  An HPV test also should be used to screen women of any age who  have unclear Pap test results.  After 45 years of age, women should have HPV testing as often as Pap tests.  Colorectal Cancer  This type of cancer can be detected and often prevented.  Routine colorectal cancer screening usually begins at 45 years of age and continues through 45 years of age.  Your health care provider may recommend screening at an earlier age if you have risk factors for colon cancer.  Your health care provider may also recommend using home test kits to check for hidden blood in the stool.  A small camera at the end of a tube can be used to examine your colon directly (sigmoidoscopy or colonoscopy). This is done to check for the earliest forms of colorectal cancer.  Routine screening usually begins at age 50.  Direct examination of the colon should be repeated every 5-10 years through 45 years of age. However, you may need to be screened more often if early forms of precancerous polyps or small growths are found. Skin Cancer  Check your skin from head to toe regularly.  Tell your health care provider about any new moles or changes in moles, especially if there is a change in a mole's shape or color.  Also tell your health care provider if you have a mole that is larger than the size of a pencil eraser.  Always use sunscreen. Apply sunscreen liberally and repeatedly throughout the day.  Protect yourself by wearing long sleeves, pants, a wide-brimmed hat, and sunglasses whenever you are outside. HEART DISEASE, DIABETES, AND HIGH BLOOD PRESSURE   Have your blood pressure checked at least every 1-2 years. High blood pressure causes heart disease and increases the risk of stroke.  If you are between 55 years and 79 years old, ask your health care provider if you should take aspirin to prevent strokes.  Have regular diabetes screenings. This involves taking a blood sample to check your fasting blood sugar level.  If you are at a normal weight and have a low risk for  diabetes, have this test once every three years after 45 years of age.  If you are overweight and have a high risk for diabetes, consider being tested at a younger age or more often. PREVENTING INFECTION  Hepatitis B    If you have a higher risk for hepatitis B, you should be screened for this virus. You are considered at high risk for hepatitis B if:  You were born in a country where hepatitis B is common. Ask your health care provider which countries are considered high risk.  Your parents were born in a high-risk country, and you have not been immunized against hepatitis B (hepatitis B vaccine).  You have HIV or AIDS.  You use needles to inject street drugs.  You live with someone who has hepatitis B.  You have had sex with someone who has hepatitis B.  You get hemodialysis treatment.  You take certain medicines for conditions, including cancer, organ transplantation, and autoimmune conditions. Hepatitis C  Blood testing is recommended for:  Everyone born from 1945 through 1965.  Anyone with known risk factors for hepatitis C. Sexually transmitted infections (STIs)  You should be screened for sexually transmitted infections (STIs) including gonorrhea and chlamydia if:  You are sexually active and are younger than 45 years of age.  You are older than 45 years of age and your health care provider tells you that you are at risk for this type of infection.  Your sexual activity has changed since you were last screened and you are at an increased risk for chlamydia or gonorrhea. Ask your health care provider if you are at risk.  If you do not have HIV, but are at risk, it may be recommended that you take a prescription medicine daily to prevent HIV infection. This is called pre-exposure prophylaxis (PrEP). You are considered at risk if:  You are sexually active and do not regularly use condoms or know the HIV status of your partner(s).  You take drugs by injection.  You are  sexually active with a partner who has HIV. Talk with your health care provider about whether you are at high risk of being infected with HIV. If you choose to begin PrEP, you should first be tested for HIV. You should then be tested every 3 months for as long as you are taking PrEP.  PREGNANCY   If you are premenopausal and you may become pregnant, ask your health care provider about preconception counseling.  If you may become pregnant, take 400 to 800 micrograms (mcg) of folic acid every day.  If you want to prevent pregnancy, talk to your health care provider about birth control (contraception). OSTEOPOROSIS AND MENOPAUSE   Osteoporosis is a disease in which the bones lose minerals and strength with aging. This can result in serious bone fractures. Your risk for osteoporosis can be identified using a bone density scan.  If you are 65 years of age or older, or if you are at risk for osteoporosis and fractures, ask your health care provider if you should be screened.  Ask your health care provider whether you should take a calcium or vitamin D supplement to lower your risk for osteoporosis.  Menopause may have certain physical symptoms and risks.  Hormone replacement therapy may reduce some of these symptoms and risks. Talk to your health care provider about whether hormone replacement therapy is right for you.  HOME CARE INSTRUCTIONS   Schedule regular health, dental, and eye exams.  Stay current with your immunizations.   Do not use any tobacco products including cigarettes, chewing tobacco, or electronic cigarettes.  If you are pregnant, do not drink alcohol.  If you are breastfeeding, limit how much and how often you drink alcohol.  Limit alcohol   intake to no more than 1 drink per day for nonpregnant women. One drink equals 12 ounces of beer, 5 ounces of wine, or 1 ounces of hard liquor.  Do not use street drugs.  Do not share needles.  Ask your health care provider for  help if you need support or information about quitting drugs.  Tell your health care provider if you often feel depressed.  Tell your health care provider if you have ever been abused or do not feel safe at home. Document Released: 07/25/2010 Document Revised: 05/26/2013 Document Reviewed: 12/11/2012 ExitCare Patient Information 2015 [employer]. This information is not intended to replace advice given to you by your health care provider. Make sure you discuss any questions you have with your health care provider.    

## 2016-04-27 NOTE — Progress Notes (Signed)
    Lisa Wang 01/03/1971 [id number]        45 y.o.  G2P2 for annual exam.    Past medical history,surgical history, problem list, medications, allergies, family history and social history were all reviewed and documented as reviewed in the EPIC chart.  ROS:  Performed with pertinent positives and negatives included in the history, assessment and plan.   Additional significant findings :  none   Exam Caryn Bee assistant Vitals:   04/27/16 0829  BP: 112/70  Weight: 133 lb (60.3 kg)  Height: 5\' 4"  (1.626 m)   Body mass index is 22.83 kg/m.  General appearance:  Normal affect, orientation and appearance. Skin: Grossly normal HEENT: Without gross lesions.  No cervical or supraclavicular adenopathy. Thyroid normal.  Lungs:  Clear without wheezing, rales or rhonchi Cardiac: RR, without RMG Abdominal:  Soft, nontender, without masses, guarding, rebound, organomegaly or hernia Breasts:  Examined lying and sitting without masses, retractions, discharge or axillary adenopathy. Pelvic:  Ext, BUS, Vag : Normal  Cervix: Normal  Uterus: Anteverted, normal size, shape and contour, midline and mobile nontender   Adnexa: Without masses or tenderness    Anus and perineum: Normal   Rectovaginal: Normal sphincter tone without palpated masses or tenderness.    Assessment/Plan:  46 y.o. G2P2 female for annual exam.   1. Dysmenorrhea. Using 1/20 equivalent BCPs for menstrual suppression. Status post ablation and BTL in the past. Doing well with this and wants to continue. Risks of thrombosis have been discussed with her and accepted. Refill 1 year provided. 2. Postcoital UTIs. Uses Macrobid after intercourse. Macrobid 100 mg #30 with 3 refills provided. Check UA today. 3. Mammography 2015. Reminded patient she is overdue recommended she call and schedule and she agrees to do so. SBE monthly reviewed. 4. Pap smear/HPV 2016 negative. No Pap smear done today. 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 5. History of small leiomyoma and small probable hydrosalpinx with stable ultrasound last year. No symptoms such as pain or bloating. Exam is normal. Continue to follow with annual exams. 6. History of recurrent yeast infections. Has not been bothered recently. Will call if needs Diflucan. 7. Health maintenance. Baseline CBC, CMP and urinalysis ordered. Lipid profile last year was excellent. Follow up 1 year, sooner as needed.   Anastasio Auerbach MD, 8:49 AM 04/27/2016

## 2016-10-06 ENCOUNTER — Other Ambulatory Visit: Payer: Self-pay | Admitting: Gynecology

## 2016-11-15 DIAGNOSIS — Z1231 Encounter for screening mammogram for malignant neoplasm of breast: Secondary | ICD-10-CM | POA: Diagnosis not present

## 2017-05-09 DIAGNOSIS — M2392 Unspecified internal derangement of left knee: Secondary | ICD-10-CM | POA: Diagnosis not present

## 2017-05-09 DIAGNOSIS — M25462 Effusion, left knee: Secondary | ICD-10-CM | POA: Diagnosis not present

## 2017-06-04 ENCOUNTER — Other Ambulatory Visit: Payer: Self-pay | Admitting: Gynecology

## 2017-06-04 NOTE — Telephone Encounter (Signed)
CE scheduled 07/16/17.

## 2017-07-16 ENCOUNTER — Encounter: Payer: Self-pay | Admitting: Gynecology

## 2017-07-16 ENCOUNTER — Ambulatory Visit (INDEPENDENT_AMBULATORY_CARE_PROVIDER_SITE_OTHER): Payer: 59 | Admitting: Gynecology

## 2017-07-16 VITALS — BP 116/74 | Ht 64.5 in | Wt 132.0 lb

## 2017-07-16 DIAGNOSIS — Z13220 Encounter for screening for lipoid disorders: Secondary | ICD-10-CM

## 2017-07-16 DIAGNOSIS — Z01419 Encounter for gynecological examination (general) (routine) without abnormal findings: Secondary | ICD-10-CM

## 2017-07-16 LAB — CBC WITH DIFFERENTIAL/PLATELET
BASOS ABS: 32 {cells}/uL (ref 0–200)
Basophils Relative: 0.5 %
EOS ABS: 101 {cells}/uL (ref 15–500)
EOS PCT: 1.6 %
HCT: 36.8 % (ref 35.0–45.0)
HEMOGLOBIN: 13 g/dL (ref 11.7–15.5)
Lymphs Abs: 1871 cells/uL (ref 850–3900)
MCH: 29.3 pg (ref 27.0–33.0)
MCHC: 35.3 g/dL (ref 32.0–36.0)
MCV: 82.9 fL (ref 80.0–100.0)
MONOS PCT: 7.9 %
MPV: 9.7 fL (ref 7.5–12.5)
NEUTROS ABS: 3799 {cells}/uL (ref 1500–7800)
Neutrophils Relative %: 60.3 %
PLATELETS: 286 10*3/uL (ref 140–400)
RBC: 4.44 10*6/uL (ref 3.80–5.10)
RDW: 12 % (ref 11.0–15.0)
TOTAL LYMPHOCYTE: 29.7 %
WBC mixed population: 498 cells/uL (ref 200–950)
WBC: 6.3 10*3/uL (ref 3.8–10.8)

## 2017-07-16 LAB — COMPREHENSIVE METABOLIC PANEL
AG RATIO: 1.6 (calc) (ref 1.0–2.5)
ALBUMIN MSPROF: 3.9 g/dL (ref 3.6–5.1)
ALKALINE PHOSPHATASE (APISO): 138 U/L — AB (ref 33–115)
ALT: 17 U/L (ref 6–29)
AST: 16 U/L (ref 10–35)
BILIRUBIN TOTAL: 0.5 mg/dL (ref 0.2–1.2)
BUN: 11 mg/dL (ref 7–25)
CHLORIDE: 103 mmol/L (ref 98–110)
CO2: 30 mmol/L (ref 20–32)
CREATININE: 0.74 mg/dL (ref 0.50–1.10)
Calcium: 9.1 mg/dL (ref 8.6–10.2)
GLOBULIN: 2.5 g/dL (ref 1.9–3.7)
Glucose, Bld: 85 mg/dL (ref 65–99)
POTASSIUM: 4.3 mmol/L (ref 3.5–5.3)
Sodium: 139 mmol/L (ref 135–146)
Total Protein: 6.4 g/dL (ref 6.1–8.1)

## 2017-07-16 LAB — LIPID PANEL
CHOLESTEROL: 171 mg/dL (ref <200)
HDL: 60 mg/dL (ref >50)
LDL Cholesterol (Calc): 92 mg/dL (calc)
Non-HDL Cholesterol (Calc): 111 mg/dL (calc) (ref <130)
Total CHOL/HDL Ratio: 2.9 (calc) (ref <5.0)
Triglycerides: 96 mg/dL (ref <150)

## 2017-07-16 MED ORDER — FLUCONAZOLE 150 MG PO TABS
ORAL_TABLET | ORAL | 0 refills | Status: DC
Start: 2017-07-16 — End: 2018-07-18

## 2017-07-16 MED ORDER — NITROFURANTOIN MONOHYD MACRO 100 MG PO CAPS: ORAL_CAPSULE | ORAL | 3 refills | Status: DC

## 2017-07-16 NOTE — Progress Notes (Signed)
    Lisa Wang April 12, 1970 [id number]        47 y.o.  G2P2 for annual gynecologic exam.  Doing well without complaints.  Past medical history,surgical history, problem list, medications, allergies, family history and social history were all reviewed and documented as reviewed in the EPIC chart.  ROS:  Performed with pertinent positives and negatives included in the history, assessment and plan.   Additional significant findings : None   Exam: Caryn Bee assistant Vitals:   07/16/17 0801  BP: 116/74  Weight: 132 lb (59.9 kg)  Height: 5' 4.5" (1.638 m)   Body mass index is 22.31 kg/m.  General appearance:  Normal affect, orientation and appearance. Skin: Grossly normal HEENT: Without gross lesions.  No cervical or supraclavicular adenopathy. Thyroid normal.  Lungs:  Clear without wheezing, rales or rhonchi Cardiac: RR, without RMG Abdominal:  Soft, nontender, without masses, guarding, rebound, organomegaly or hernia Breasts:  Examined lying and sitting without masses, retractions, discharge or axillary adenopathy. Pelvic:  Ext, BUS, Vagina: Normal  Cervix: Normal  Uterus: Anteverted, normal size, shape and contour, midline and mobile nontender   Adnexa: Without masses or tenderness    Anus and perineum: Normal   Rectovaginal: Normal sphincter tone without palpated masses or tenderness.    Assessment/Plan:  47 y.o. G2P2 female for annual gynecologic exam without menses status post ablation, tubal sterilization.   1. Doing well without menses.  Had been on oral contraceptives for menstrual suppression but stopped them and has done no bleeding. 2. History of postcoital UTIs.  Uses Macrobid 100 mg with intercourse.  #30 with 3 refills provided. 3. Pap smear/HPV 2016.  No Pap smear done today.  History of CIN-2 in 2008 with normal Pap smears since.  Plan repeat Pap smear next year at 4 to 5-year interval per current screening guidelines. 4. Mammography 10/2016.  Continue with  annual mammography when due.  Breast exam normal today. 5. History of recurrent yeast infections.  Uses Diflucan intermittently with good results.  Diflucan 150 mg #5 provided to use as needed.  History of small leiomyoma and probable hydrosalpinx stable on serial ultrasounds.  Exam is normal.  Continue with annual surveillance exams. 6. Health maintenance.  Requests baseline labs.  CBC, CMP and lipid profile ordered.  Follow-up 1 year, sooner as needed.   Anastasio Auerbach MD, 8:33 AM 07/16/2017

## 2017-07-16 NOTE — Patient Instructions (Signed)
Follow-up in 1 year for annual exam, sooner as needed. 

## 2018-01-31 DIAGNOSIS — M94262 Chondromalacia, left knee: Secondary | ICD-10-CM | POA: Insufficient documentation

## 2018-07-18 ENCOUNTER — Other Ambulatory Visit: Payer: Self-pay

## 2018-07-18 ENCOUNTER — Ambulatory Visit (INDEPENDENT_AMBULATORY_CARE_PROVIDER_SITE_OTHER): Payer: 59 | Admitting: Gynecology

## 2018-07-18 ENCOUNTER — Encounter: Payer: Self-pay | Admitting: Gynecology

## 2018-07-18 VITALS — BP 124/74 | Ht 64.0 in | Wt 141.0 lb

## 2018-07-18 DIAGNOSIS — Z13220 Encounter for screening for lipoid disorders: Secondary | ICD-10-CM | POA: Diagnosis not present

## 2018-07-18 DIAGNOSIS — N912 Amenorrhea, unspecified: Secondary | ICD-10-CM

## 2018-07-18 DIAGNOSIS — Z1151 Encounter for screening for human papillomavirus (HPV): Secondary | ICD-10-CM

## 2018-07-18 DIAGNOSIS — N852 Hypertrophy of uterus: Secondary | ICD-10-CM

## 2018-07-18 DIAGNOSIS — Z01419 Encounter for gynecological examination (general) (routine) without abnormal findings: Secondary | ICD-10-CM | POA: Diagnosis not present

## 2018-07-18 MED ORDER — FLUCONAZOLE 150 MG PO TABS: ORAL_TABLET | ORAL | 0 refills | Status: DC

## 2018-07-18 MED ORDER — NITROFURANTOIN MONOHYD MACRO 100 MG PO CAPS: ORAL_CAPSULE | ORAL | 3 refills | Status: DC

## 2018-07-18 NOTE — Progress Notes (Signed)
    Lisa Wang 01-18-1971 [id number]        48 y.o.  G2P2 for annual gynecologic exam.  Doing well from a gynecologic standpoint.  Continues with no menses status post ablation.  Past medical history,surgical history, problem list, medications, allergies, family history and social history were all reviewed and documented as reviewed in the EPIC chart.  ROS:  Performed with pertinent positives and negatives included in the history, assessment and plan.   Additional significant findings : None   Exam: Caryn Bee assistant Vitals:   07/18/18 0801  BP: 124/74  Weight: 141 lb (64 kg)  Height: 5\' 4"  (1.626 m)   Body mass index is 24.2 kg/m.  General appearance:  Normal affect, orientation and appearance. Skin: Grossly normal HEENT: Without gross lesions.  No cervical or supraclavicular adenopathy. Thyroid normal.  Lungs:  Clear without wheezing, rales or rhonchi Cardiac: RR, without RMG Abdominal:  Soft, nontender, without masses, guarding, rebound, organomegaly or hernia Breasts:  Examined lying and sitting without masses, retractions, discharge or axillary adenopathy. Pelvic:  Ext, BUS, Vagina: Normal  Cervix: Normal.  Pap smear/HPV  Uterus: Bulky, irregular, midline, mobile, nontender,   Adnexa: Without masses or tenderness    Anus and perineum: Normal   Rectovaginal: Normal sphincter tone without palpated masses or tenderness.    Assessment/Plan:  48 y.o. G2P2 female for annual gynecologic exam.  Without menses status post ablation, tubal sterilization  1. Bulky uterus.  Exam was normal last year.  Does have a history of small myomas ultrasonographically in the past.  Recommends ultrasound now.  Discussed possibilities to include stable leiomyoma, enlarging myomas, ovarian process, non-GYN.  Patient will schedule in follow-up for this. 2. History of postcoital UTIs.  Uses Macrobid with intercourse with good results.  Macrobid 100 mg #30 with 3 refills provided.  3.  History of occasional yeast infections.  Uses Diflucan intermittently with good results. 4. Mammography 2018.  Again strongly recommended she schedule a screening mammogram.  Most common cancer in women reviewed.  Patient agrees to call and schedule.  Breast exam normal today. 5. Pap smear/HPV 02/2014.  Pap smear/HPV today.  History of CIN-2 2008 with normal Pap smears since. 6. Health maintenance.  Baseline CBC, CMP and lipid profile ordered.  Complaints of difficulty concentrating.  Discussed possibilities to include hormonal dysfunction.  Will check baseline TSH and FSH.  Also discussed issues with ADHD.  Recommended follow-up with psychology if difficulty with concentration continues.  Follow-up for ultrasound.  Follow-up in 1 year for annual exam.   Anastasio Auerbach MD, 8:41 AM 07/18/2018

## 2018-07-18 NOTE — Addendum Note (Signed)
Addended by: Nelva Nay on: 07/18/2018 09:44 AM   Modules accepted: Orders

## 2018-07-18 NOTE — Patient Instructions (Signed)
Follow-up for the ultrasound as scheduled. 

## 2018-07-19 ENCOUNTER — Encounter: Payer: Self-pay | Admitting: Gynecology

## 2018-07-19 LAB — CBC WITH DIFFERENTIAL/PLATELET
Absolute Monocytes: 458 cells/uL (ref 200–950)
Basophils Absolute: 31 cells/uL (ref 0–200)
Basophils Relative: 0.5 %
Eosinophils Absolute: 67 cells/uL (ref 15–500)
Eosinophils Relative: 1.1 %
HCT: 40.7 % (ref 35.0–45.0)
Hemoglobin: 13.6 g/dL (ref 11.7–15.5)
Lymphs Abs: 1995 cells/uL (ref 850–3900)
MCH: 29.2 pg (ref 27.0–33.0)
MCHC: 33.4 g/dL (ref 32.0–36.0)
MCV: 87.5 fL (ref 80.0–100.0)
MPV: 9.3 fL (ref 7.5–12.5)
Monocytes Relative: 7.5 %
Neutro Abs: 3550 cells/uL (ref 1500–7800)
Neutrophils Relative %: 58.2 %
Platelets: 283 10*3/uL (ref 140–400)
RBC: 4.65 10*6/uL (ref 3.80–5.10)
RDW: 12.4 % (ref 11.0–15.0)
Total Lymphocyte: 32.7 %
WBC: 6.1 10*3/uL (ref 3.8–10.8)

## 2018-07-19 LAB — COMPREHENSIVE METABOLIC PANEL
AG Ratio: 1.7 (calc) (ref 1.0–2.5)
ALT: 15 U/L (ref 6–29)
AST: 16 U/L (ref 10–35)
Albumin: 4.1 g/dL (ref 3.6–5.1)
Alkaline phosphatase (APISO): 89 U/L (ref 31–125)
BUN: 13 mg/dL (ref 7–25)
CO2: 26 mmol/L (ref 20–32)
Calcium: 9.1 mg/dL (ref 8.6–10.2)
Chloride: 105 mmol/L (ref 98–110)
Creat: 0.73 mg/dL (ref 0.50–1.10)
Globulin: 2.4 g/dL (calc) (ref 1.9–3.7)
Glucose, Bld: 86 mg/dL (ref 65–99)
Potassium: 4.3 mmol/L (ref 3.5–5.3)
Sodium: 137 mmol/L (ref 135–146)
Total Bilirubin: 0.5 mg/dL (ref 0.2–1.2)
Total Protein: 6.5 g/dL (ref 6.1–8.1)

## 2018-07-19 LAB — PAP IG AND HPV HIGH-RISK: HPV DNA High Risk: NOT DETECTED

## 2018-07-19 LAB — LIPID PANEL
Cholesterol: 174 mg/dL (ref <200)
HDL: 65 mg/dL (ref >=50)
LDL Cholesterol (Calc): 94 mg/dL (calc)
Non-HDL Cholesterol (Calc): 109 mg/dL (calc) (ref <130)
Total CHOL/HDL Ratio: 2.7 (calc) (ref <5.0)
Triglycerides: 61 mg/dL (ref <150)

## 2018-07-19 LAB — FOLLICLE STIMULATING HORMONE: FSH: 5.9 m[IU]/mL

## 2018-07-19 LAB — TSH: TSH: 1.03 mIU/L

## 2018-07-19 NOTE — Telephone Encounter (Signed)
Patient called informed with results.

## 2018-08-07 ENCOUNTER — Other Ambulatory Visit: Payer: Self-pay

## 2018-08-08 ENCOUNTER — Ambulatory Visit: Payer: 59 | Admitting: Gynecology

## 2018-08-08 ENCOUNTER — Encounter: Payer: Self-pay | Admitting: Gynecology

## 2018-08-08 ENCOUNTER — Ambulatory Visit (INDEPENDENT_AMBULATORY_CARE_PROVIDER_SITE_OTHER): Payer: 59

## 2018-08-08 VITALS — BP 116/76

## 2018-08-08 DIAGNOSIS — N852 Hypertrophy of uterus: Secondary | ICD-10-CM | POA: Diagnosis not present

## 2018-08-08 DIAGNOSIS — D251 Intramural leiomyoma of uterus: Secondary | ICD-10-CM | POA: Diagnosis not present

## 2018-08-08 NOTE — Progress Notes (Signed)
    Lisa Wang January 09, 1971 [id number]        48 y.o.  G2P2 presents for follow-up ultrasound.  Her uterus felt a little more bulky and irregular from her prior exams due to her leiomyoma and I wanted to do a follow-up ultrasound to make sure there was not an ovarian issue.  She is otherwise doing well without symptoms from her leiomyoma.  Past medical history,surgical history, problem list, medications, allergies, family history and social history were all reviewed and documented in the EPIC chart.  Directed ROS with pertinent positives and negatives documented in the history of present illness/assessment and plan.  Exam: Vitals:   08/08/18 1106  BP: 116/76   General appearance:  Normal  Ultrasound transvaginal shows uterus generous in size with multiple small myomas measured largest 40 mm.  Without significant change from her prior study 2017.  Endometrial echo 2.9 mm irregular noting history of ablation.  Right and left ovaries grossly normal with corpus luteal cyst noted on the left ovary.  Small tubular cystic structure left adnexa 11 x 25 mm unchanged from prior ultrasound  Assessment/Plan:  48 y.o. G2P2 with multiple small myomas.  Uterus overall was without significant change in size from her prior study.  We will continue to follow expectantly with follow-up in 1 year at her next annual exam.    Anastasio Auerbach MD, 11:39 AM 08/08/2018

## 2018-08-08 NOTE — Patient Instructions (Signed)
Follow-up in 1 year for annual exam, sooner if any issues. 

## 2018-10-15 ENCOUNTER — Encounter: Payer: Self-pay | Admitting: Gynecology

## 2018-11-20 ENCOUNTER — Encounter: Payer: Self-pay | Admitting: Gynecology

## 2019-01-22 ENCOUNTER — Other Ambulatory Visit: Payer: Self-pay | Admitting: Gynecology

## 2019-07-24 ENCOUNTER — Other Ambulatory Visit: Payer: Self-pay

## 2019-07-24 ENCOUNTER — Ambulatory Visit (INDEPENDENT_AMBULATORY_CARE_PROVIDER_SITE_OTHER): Payer: 59 | Admitting: Obstetrics and Gynecology

## 2019-07-24 ENCOUNTER — Encounter: Payer: Self-pay | Admitting: Obstetrics and Gynecology

## 2019-07-24 VITALS — BP 118/76 | Ht 64.0 in | Wt 138.0 lb

## 2019-07-24 DIAGNOSIS — N852 Hypertrophy of uterus: Secondary | ICD-10-CM | POA: Diagnosis not present

## 2019-07-24 DIAGNOSIS — Z01419 Encounter for gynecological examination (general) (routine) without abnormal findings: Secondary | ICD-10-CM | POA: Diagnosis not present

## 2019-07-24 DIAGNOSIS — Z13220 Encounter for screening for lipoid disorders: Secondary | ICD-10-CM | POA: Diagnosis not present

## 2019-07-24 DIAGNOSIS — D251 Intramural leiomyoma of uterus: Secondary | ICD-10-CM | POA: Diagnosis not present

## 2019-07-24 LAB — LIPID PANEL
Cholesterol: 167 mg/dL (ref <200)
HDL: 58 mg/dL (ref >=50)
LDL Cholesterol (Calc): 98 mg/dL (calc)
Non-HDL Cholesterol (Calc): 109 mg/dL (calc) (ref <130)
Total CHOL/HDL Ratio: 2.9 (calc) (ref <5.0)
Triglycerides: 36 mg/dL (ref <150)

## 2019-07-24 LAB — CBC
HCT: 36.6 % (ref 35.0–45.0)
Hemoglobin: 12.4 g/dL (ref 11.7–15.5)
MCH: 29.5 pg (ref 27.0–33.0)
MCHC: 33.9 g/dL (ref 32.0–36.0)
MCV: 86.9 fL (ref 80.0–100.0)
MPV: 9.4 fL (ref 7.5–12.5)
Platelets: 262 10*3/uL (ref 140–400)
RBC: 4.21 10*6/uL (ref 3.80–5.10)
RDW: 12.2 % (ref 11.0–15.0)
WBC: 5.6 10*3/uL (ref 3.8–10.8)

## 2019-07-24 LAB — COMPREHENSIVE METABOLIC PANEL
AG Ratio: 2 (calc) (ref 1.0–2.5)
ALT: 11 U/L (ref 6–29)
AST: 14 U/L (ref 10–35)
Albumin: 4.3 g/dL (ref 3.6–5.1)
Alkaline phosphatase (APISO): 93 U/L (ref 31–125)
BUN: 13 mg/dL (ref 7–25)
CO2: 27 mmol/L (ref 20–32)
Calcium: 9.3 mg/dL (ref 8.6–10.2)
Chloride: 105 mmol/L (ref 98–110)
Creat: 0.73 mg/dL (ref 0.50–1.10)
Globulin: 2.2 g/dL (calc) (ref 1.9–3.7)
Glucose, Bld: 92 mg/dL (ref 65–99)
Potassium: 4.2 mmol/L (ref 3.5–5.3)
Sodium: 139 mmol/L (ref 135–146)
Total Bilirubin: 0.5 mg/dL (ref 0.2–1.2)
Total Protein: 6.5 g/dL (ref 6.1–8.1)

## 2019-07-24 MED ORDER — FLUCONAZOLE 150 MG PO TABS: ORAL_TABLET | ORAL | 0 refills | Status: DC

## 2019-07-24 MED ORDER — NITROFURANTOIN MONOHYD MACRO 100 MG PO CAPS: ORAL_CAPSULE | ORAL | 3 refills | Status: DC

## 2019-07-24 NOTE — Progress Notes (Signed)
   Lisa Wang 25-Nov-1970 [id number]  SUBJECTIVE:  49 y.o. G2P2 female for annual routine gynecologic exam. She has no gynecologic concerns. Having some stomach issues and trying dietary changes.  History of IBS, feels more bloating lately.  No bowel movement changes, chronically without a daily BM.  OTC medications help.  May be stress related.  Current Outpatient Medications  Medication Sig Dispense Refill  . losartan (COZAAR) 25 MG tablet Take 25 mg by mouth daily.     No current facility-administered medications for this visit.   Allergies: Benzoin and Penicillins  No LMP recorded. Patient has had an ablation.  Past medical history,surgical history, problem list, medications, allergies, family history and social history were all reviewed and documented as reviewed in the EPIC chart.  ROS:  Feeling well. No dyspnea or chest pain on exertion.  No abdominal pain, change in bowel habits, black or bloody stools.  No urinary tract symptoms. GYN ROS: no abnormal bleeding, pelvic pain or discharge, no breast pain or new or enlarging lumps on self exam. No neurological complaints.   OBJECTIVE:  BP 118/76   Ht 5\' 4"  (1.626 m)   Wt 138 lb (62.6 kg)   BMI 23.69 kg/m  The patient appears well, alert, oriented x 3, in no distress. ENT normal.  Neck supple. No cervical or supraclavicular adenopathy or thyromegaly.  Lungs are clear, good air entry, no wheezes, rhonchi or rales. S1 and S2 normal, no murmurs, regular rate and rhythm.  Abdomen soft without tenderness, guarding, mass or organomegaly.  Neurological is normal, no focal findings.  BREAST EXAM: breasts appear normal, no suspicious masses, no skin or nipple changes or axillary nodes  PELVIC EXAM: VULVA: normal appearing vulva with no masses, tenderness or lesions, VAGINA: normal appearing vagina with normal color and discharge, no lesions, CERVIX: normal appearing cervix without discharge or lesions, UTERUS: uterus is mildly enlarged  to 10 weeks size, irregular shape, normal consistency and nontender, ADNEXA: normal adnexa in size, nontender and no masses  Chaperone: Caryn Bee present during the examination  ASSESSMENT:  49 y.o. G2P2 here for annual gynecologic exam  PLAN:   1. History leiomyomas.  Stable size on ultrasound 07/2018 and bimanual exam today.  Previous endometrial ablation and remains amenorrheic. 2. Pap smear/HPV 06/2018.  History CIN-2 in 2008, normal Pap smears since then.  Next Pap smear due 2025 following the current guidelines recommending the 5 year interval. 3.  History of postcoital UTIs.  Uses Macrobid after intercourse.  This has been helpful for her to prevent UTIs.  Macrobid 100 mg #30 with 3 refills provided. 4.  History of occasional yeast infections and uses Diflucan intermittently with good results.  Refill 150 mg tablets provided. 5. Contraception. Previous tubal ligation.  6. Mammogram 10/2018.  Breast exam normal.  Continue with annual mammograms. 7. Health maintenance.  She will proceed to lab today for routine screening blood work (lipids, CBC, CMP).  Will reduce stress as able, if bowel issues continue and are bothersome, she will follow up with her primary doctor.  Return annually or sooner, prn.  Joseph Pierini MD 07/24/19

## 2019-08-18 DIAGNOSIS — M2241 Chondromalacia patellae, right knee: Secondary | ICD-10-CM | POA: Insufficient documentation

## 2019-11-12 ENCOUNTER — Other Ambulatory Visit: Payer: Self-pay | Admitting: Obstetrics and Gynecology

## 2019-12-29 ENCOUNTER — Ambulatory Visit (INDEPENDENT_AMBULATORY_CARE_PROVIDER_SITE_OTHER): Payer: BC Managed Care – PPO

## 2019-12-29 ENCOUNTER — Ambulatory Visit: Payer: BC Managed Care – PPO | Admitting: Podiatry

## 2019-12-29 ENCOUNTER — Other Ambulatory Visit: Payer: Self-pay

## 2019-12-29 DIAGNOSIS — G5792 Unspecified mononeuropathy of left lower limb: Secondary | ICD-10-CM

## 2019-12-29 MED ORDER — MELOXICAM 15 MG PO TABS: 15.0000 mg | ORAL_TABLET | Freq: Every day | ORAL | 1 refills | Status: DC

## 2019-12-29 NOTE — Progress Notes (Signed)
   HPI: 49 y.o. female presenting today for evaluation of pain and irritation to the fourth and fifth digits of the left foot.  Patient states that the pain has been going on for approximately 4-5 months now.  Aggravated by wearing heels.  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  Otherwise she works at home.  She has not done anything for treatment.  Past Medical History:  Diagnosis Date  . HGSIL (high grade squamous intraepithelial dysplasia) 2008   CIN 2  . Hypertension      Physical Exam: General: The patient is alert and oriented x3 in no acute distress.  Dermatology: Skin is warm, dry and supple bilateral lower extremities. Negative for open lesions or macerations.  Vascular: Palpable pedal pulses bilaterally. No edema or erythema noted. Capillary refill within normal limits.  Neurological: Epicritic and protective threshold grossly intact bilaterally.  Patient describes her symptoms as feeling as if there is a sock bunched up between her toes.  She also experiences numbness tingling and electrical sensations to the toes  Musculoskeletal Exam: Range of motion within normal limits to all pedal and ankle joints bilateral. Muscle strength 5/5 in all groups bilateral.  There is some pain and tenderness to the lateral aspect of the fifth toe left foot.  Radiographic Exam:  Normal osseous mineralization. Joint spaces preserved. No fracture/dislocation/boney destruction.  Clinical evidence of a tailor's bunion for many.  Clinically this is asymptomatic  Assessment: 1.  Neuritis fourth and fifth toes left foot   Plan of Care:  1. Patient evaluated. X-Rays reviewed.  2.  Patient declined Medrol Dosepak 3.  Prescription for meloxicam 15 mg daily 4.  Explained to the patient that this is most likely secondary to heels and pointy shoes.  Recommend wide fitting shoes that give plenty of room in the toebox 5.  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 Edrick Kins, DPM    [address], [place] [address]                Office [phone]  Fax [phone]

## 2020-05-05 ENCOUNTER — Telehealth: Payer: Self-pay | Admitting: *Deleted

## 2020-05-05 NOTE — Telephone Encounter (Signed)
Med refill request: Diflucan 150 mg tab PRN for yeast. Last prescribed 11/12/19 #5/0RF Last AEX: 07/24/19 w/ Dr. Delilah Shan Next AEX: Not scheduled Last MMG: N/A   Call placed to patient, left detailed message, ok per dpr.  Advised to return call to [facility] triage at [phone], for further evaluation.    Routing to [employer] triage.

## 2020-05-10 ENCOUNTER — Other Ambulatory Visit: Payer: Self-pay

## 2020-05-10 MED ORDER — FLUCONAZOLE 150 MG PO TABS
ORAL_TABLET | ORAL | 0 refills | Status: DC
Start: 2020-05-10 — End: 2020-07-20

## 2020-05-10 NOTE — Telephone Encounter (Signed)
AEX with Dr. Delilah Shan on 07/24/19.  Dr. Delilah Shan last prescribed Diflucan 11/12/19.

## 2020-05-25 NOTE — Telephone Encounter (Signed)
See med refill encounter dated 05/10/20.   Encounter closed.

## 2020-07-13 NOTE — Progress Notes (Signed)
50 y.o. G2P2 Married White or Caucasian Not Hispanic or Latino female here for annual exam.She states that she has more discharge than normal.  She just noticed an increase in vaginal d/c after intercourse, white, maybe clumpy. No itching, burning, irritation or odor.  H/O endometrial ablation 14.5 years ago, no cycles since then. There was a time that she had some spotting (several years ago), no bleeding since then.  She has some night sweats, just on occasion. No hot flashes, no sleep disturbance.    She uses macrodantin with intercourse for prophylaxis.  Would like a refill for diflucan, gets yeast sometime with the macrodantin.  No LMP recorded. Patient has had an ablation.          Sexually active: Yes.    The current method of family planning is Ablation .    Exercising: Yes.     Pure Barr Smoker:  no  Health Maintenance: Pap:  07/18/18 wnl Hr Hpv Neg; 02/25/14 WNL; 03/29/11 WNL  History of abnormal Pap:  yes, leep in 2008.  MMG:  11/20/18 Bi-rads 1 neg  BMD:   NA  Colonoscopy: none  TDaP:  unsure  Gardasil: NA   reports that she has never smoked. She has never used smokeless tobacco. She reports current alcohol use. She reports that she does not use drugs. 2 year old son, 36 year old daughter (going to [employer] in the fall). 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    Past Medical History:  Diagnosis Date   HGSIL (high grade squamous intraepithelial dysplasia) 2008   CIN 2   Hypertension     Past Surgical History:  Procedure Laterality Date   CERVICAL BIOPSY  W/ LOOP ELECTRODE EXCISION  11/2006   CESAREAN SECTION     X2 AND WITH BTL   COLPOSCOPY     ENDOMETRIAL ABLATION  12/2005   NOVASURE   KNEE SURGERY     TUBAL LIGATION     AT C/S    Current Outpatient Medications  Medication Sig Dispense Refill   losartan (COZAAR) 25 MG tablet Take 25 mg by mouth daily.     nitrofurantoin, macrocrystal-monohydrate, (MACROBID) 100 MG capsule TAKE 1 CAPSULE AFTER INTERCOURSE. 30 capsule 3    No current facility-administered medications for this visit.    Family History  Problem Relation Age of Onset   Hypertension Father    Hypertension Paternal Grandmother    Hypertension Paternal Grandfather     Review of Systems  Constitutional: Negative.   HENT: Negative.    Eyes: Negative.   Respiratory: Negative.    Cardiovascular: Negative.   Gastrointestinal: Negative.   Endocrine: Negative.   Genitourinary: Negative.   Musculoskeletal: Negative.   Skin: Negative.   Allergic/Immunologic: Negative.   Neurological: Negative.   Hematological: Negative.   Psychiatric/Behavioral: Negative.    All other systems reviewed and are negative.  Exam:   BP 118/64   Pulse 60   Ht 5\' 4"  (1.626 m)   Wt 142 lb (64.4 kg)   SpO2 99%   BMI 24.37 kg/m   Weight change: @WEIGHTCHANGE @ Height:   Height: 5\' 4"  (162.6 cm)  Ht Readings from Last 3 Encounters:  07/20/20 5\' 4"  (1.626 m)  07/24/19 5\' 4"  (1.626 m)  07/18/18 5\' 4"  (1.626 m)    General appearance: alert, cooperative and appears stated age Head: Normocephalic, without obvious abnormality, atraumatic Neck: no adenopathy, supple, symmetrical, trachea midline and thyroid normal to inspection and palpation Lungs: clear to auscultation bilaterally Cardiovascular: regular  rate and rhythm Breasts:  lima bean shaped lump in the left breast at 2 o'clock a few cm from the areolar region.  Abdomen: soft, non-tender; non distended,  no masses,  no organomegaly Extremities: extremities normal, atraumatic, no cyanosis or edema Skin: Skin color, texture, turgor normal. No rashes or lesions Lymph nodes: Cervical, supraclavicular, and axillary nodes normal. No abnormal inguinal nodes palpated Neurologic: Grossly normal   Pelvic: External genitalia:  no lesions              Urethra:  normal appearing urethra with no masses, tenderness or lesions              Bartholins and Skenes: normal                 Vagina: normal appearing vagina  with normal color and discharge, no lesions              Cervix: no lesions               Bimanual Exam:  Uterus:   8-10 week sized (stable), anteverted, mobile, not tender uterus              Adnexa: no mass, fullness, tenderness               Rectovaginal: Confirms               Anus:  normal sphincter tone, no lesions  Gae Dry chaperoned for the exam.  1. Well woman exam Discussed breast self exam Discussed calcium and vit D intake Pap not due this year  2. Colon cancer screening Reviewed options for screening. Will place referral for colonoscopy - Ambulatory referral to Gastroenterology  3. History of cervical dysplasia S/p leep in 2008. Needs q 3 year paps for 25 years s/p leep  4. History of endometrial ablation No bleeding  5. Laboratory exam ordered as part of routine general medical examination - CBC - Comprehensive metabolic panel - Lipid panel  6. History of recurrent UTI (urinary tract infection) - nitrofurantoin, macrocrystal-monohydrate, (MACROBID) 100 MG capsule; TAKE 1 CAPSULE AFTER INTERCOURSE.  Dispense: 30 capsule; Refill: 3  7. Breast lump on left side at 2 o'clock position Will set up diagnostic breast imaging.

## 2020-07-20 ENCOUNTER — Encounter: Payer: Self-pay | Admitting: Obstetrics and Gynecology

## 2020-07-20 ENCOUNTER — Other Ambulatory Visit: Payer: Self-pay

## 2020-07-20 ENCOUNTER — Ambulatory Visit (INDEPENDENT_AMBULATORY_CARE_PROVIDER_SITE_OTHER): Payer: No Typology Code available for payment source | Admitting: Obstetrics and Gynecology

## 2020-07-20 VITALS — BP 118/64 | HR 60 | Ht 64.0 in | Wt 142.0 lb

## 2020-07-20 DIAGNOSIS — J069 Acute upper respiratory infection, unspecified: Secondary | ICD-10-CM | POA: Insufficient documentation

## 2020-07-20 DIAGNOSIS — Z01419 Encounter for gynecological examination (general) (routine) without abnormal findings: Secondary | ICD-10-CM | POA: Diagnosis not present

## 2020-07-20 DIAGNOSIS — Z87410 Personal history of cervical dysplasia: Secondary | ICD-10-CM | POA: Diagnosis not present

## 2020-07-20 DIAGNOSIS — Z87440 Personal history of urinary (tract) infections: Secondary | ICD-10-CM

## 2020-07-20 DIAGNOSIS — Z1211 Encounter for screening for malignant neoplasm of colon: Secondary | ICD-10-CM

## 2020-07-20 DIAGNOSIS — F4320 Adjustment disorder, unspecified: Secondary | ICD-10-CM | POA: Insufficient documentation

## 2020-07-20 DIAGNOSIS — Z9889 Other specified postprocedural states: Secondary | ICD-10-CM | POA: Diagnosis not present

## 2020-07-20 DIAGNOSIS — I10 Essential (primary) hypertension: Secondary | ICD-10-CM | POA: Insufficient documentation

## 2020-07-20 DIAGNOSIS — N6321 Unspecified lump in the left breast, upper outer quadrant: Secondary | ICD-10-CM

## 2020-07-20 DIAGNOSIS — Z0000 Encounter for general adult medical examination without abnormal findings: Secondary | ICD-10-CM

## 2020-07-20 MED ORDER — NITROFURANTOIN MONOHYD MACRO 100 MG PO CAPS: ORAL_CAPSULE | ORAL | 3 refills | Status: DC

## 2020-07-20 MED ORDER — FLUCONAZOLE 150 MG PO TABS: 150.0000 mg | ORAL_TABLET | Freq: Once | ORAL | 2 refills | Status: AC

## 2020-07-20 NOTE — Patient Instructions (Signed)

## 2020-07-21 ENCOUNTER — Telehealth: Payer: Self-pay | Admitting: *Deleted

## 2020-07-21 DIAGNOSIS — N6321 Unspecified lump in the left breast, upper outer quadrant: Secondary | ICD-10-CM

## 2020-07-21 LAB — COMPREHENSIVE METABOLIC PANEL
AG Ratio: 1.8 (calc) (ref 1.0–2.5)
ALT: 19 U/L (ref 6–29)
AST: 19 U/L (ref 10–35)
Albumin: 4.2 g/dL (ref 3.6–5.1)
Alkaline phosphatase (APISO): 93 U/L (ref 31–125)
BUN: 13 mg/dL (ref 7–25)
CO2: 29 mmol/L (ref 20–32)
Calcium: 9.6 mg/dL (ref 8.6–10.2)
Chloride: 103 mmol/L (ref 98–110)
Creat: 0.7 mg/dL (ref 0.50–1.10)
Globulin: 2.4 g/dL (calc) (ref 1.9–3.7)
Glucose, Bld: 74 mg/dL (ref 65–99)
Potassium: 3.9 mmol/L (ref 3.5–5.3)
Sodium: 139 mmol/L (ref 135–146)
Total Bilirubin: 0.5 mg/dL (ref 0.2–1.2)
Total Protein: 6.6 g/dL (ref 6.1–8.1)

## 2020-07-21 LAB — LIPID PANEL
Cholesterol: 181 mg/dL (ref <200)
HDL: 70 mg/dL (ref >=50)
LDL Cholesterol (Calc): 95 mg/dL (calc)
Non-HDL Cholesterol (Calc): 111 mg/dL (calc) (ref <130)
Total CHOL/HDL Ratio: 2.6 (calc) (ref <5.0)
Triglycerides: 72 mg/dL (ref <150)

## 2020-07-21 LAB — CBC
HCT: 38.7 % (ref 35.0–45.0)
Hemoglobin: 13.1 g/dL (ref 11.7–15.5)
MCH: 29.6 pg (ref 27.0–33.0)
MCHC: 33.9 g/dL (ref 32.0–36.0)
MCV: 87.4 fL (ref 80.0–100.0)
MPV: 9.7 fL (ref 7.5–12.5)
Platelets: 282 10*3/uL (ref 140–400)
RBC: 4.43 10*6/uL (ref 3.80–5.10)
RDW: 11.9 % (ref 11.0–15.0)
WBC: 6.9 10*3/uL (ref 3.8–10.8)

## 2020-07-21 NOTE — Telephone Encounter (Signed)
-----   Message from Lisa Dom, MD sent at 07/20/2020 12:07 PM EDT ----- Please schedule this patient for diagnostic breast imaging at the breast center (wants to change from [place]). She has a lump at 2 o'clock left breast. Thanks, Sharee Pimple

## 2020-07-21 NOTE — Telephone Encounter (Signed)
Patient scheduled on 08/09/20 @ 3:00pm at the breast center. Patient informed with the below note.

## 2020-08-04 ENCOUNTER — Ambulatory Visit: Payer: BC Managed Care – PPO | Admitting: Nurse Practitioner

## 2020-08-09 ENCOUNTER — Ambulatory Visit
Admission: RE | Admit: 2020-08-09 | Discharge: 2020-08-09 | Disposition: A | Discharge status: Home / Self Care | Payer: No Typology Code available for payment source | Source: Ambulatory Visit | Attending: Obstetrics and Gynecology | Admitting: Obstetrics and Gynecology

## 2020-08-09 ENCOUNTER — Ambulatory Visit
Admission: RE | Admit: 2020-08-09 | Discharge: 2020-08-09 | Disposition: A | Discharge status: Home / Self Care | Payer: BLUE CROSS/BLUE SHIELD | Source: Ambulatory Visit | Attending: Obstetrics and Gynecology | Admitting: Obstetrics and Gynecology

## 2020-08-09 ENCOUNTER — Other Ambulatory Visit: Payer: Self-pay

## 2020-08-09 DIAGNOSIS — N6321 Unspecified lump in the left breast, upper outer quadrant: Secondary | ICD-10-CM

## 2020-08-10 ENCOUNTER — Telehealth: Payer: Self-pay

## 2020-08-10 NOTE — Telephone Encounter (Signed)
Spoke with patient and informed her of Dr. Gentry Fitz reply.  3 mos appt scheduled for recheck breast exam.

## 2020-08-10 NOTE — Telephone Encounter (Signed)
-----   Message from Salvadore Dom, MD sent at 08/10/2020 10:55 AM EDT ----- Please schedule the patient for a f/u breast check in 3 months. Even with benign imaging, we want to make sure the lump felt at her annual exam doesn't grow.

## 2020-08-10 NOTE — Telephone Encounter (Signed)
Left message to call.

## 2020-08-10 NOTE — Telephone Encounter (Signed)
Patient informed of your result note.  Patient said the radiologist seemed to have no concerns about the lump even stating that it was a benign cyst and would likely get smaller as she enters menopause.  She is interested in what your concerns are why you are requesting her to return for exam. She stated she does not mind coming back in she is just wanting to know your concerns.

## 2020-11-10 ENCOUNTER — Ambulatory Visit: Payer: No Typology Code available for payment source | Admitting: Obstetrics and Gynecology

## 2020-12-03 ENCOUNTER — Ambulatory Visit (INDEPENDENT_AMBULATORY_CARE_PROVIDER_SITE_OTHER): Payer: No Typology Code available for payment source | Admitting: Obstetrics and Gynecology

## 2020-12-03 ENCOUNTER — Encounter: Payer: Self-pay | Admitting: Obstetrics and Gynecology

## 2020-12-03 ENCOUNTER — Other Ambulatory Visit: Payer: Self-pay

## 2020-12-03 VITALS — BP 132/82

## 2020-12-03 DIAGNOSIS — N6321 Unspecified lump in the left breast, upper outer quadrant: Secondary | ICD-10-CM

## 2020-12-03 NOTE — Progress Notes (Signed)
GYNECOLOGY  VISIT   HPI: 50 y.o.   Married White or Caucasian Not Hispanic or Latino  female   G2P2 with No LMP recorded. Patient has had an ablation.   here for  follow up breast check.  She was noted to have a left breast lump at her annual exam in 6/22. Imaging showed a cyst in the inner left breast, but no abnormalities in the outer left breast where the lump was palpated.   GYNECOLOGIC HISTORY: No LMP recorded. Patient has had an ablation. Contraception:tubal ligation Menopausal hormone therapy: none        OB History     Gravida  2   Para  2   Term      Preterm      AB      Living  2      SAB      IAB      Ectopic      Multiple      Live Births                 Patient Active Problem List   Diagnosis Date Noted   Adjustment disorder 07/20/2020   Essential hypertension 07/20/2020   Upper respiratory infection 07/20/2020   Chondromalacia of right patella 08/18/2019   Chondromalacia of left knee 01/31/2018   History of cervical dysplasia     Past Medical History:  Diagnosis Date   HGSIL (high grade squamous intraepithelial dysplasia) 2008   CIN 2   Hypertension     Past Surgical History:  Procedure Laterality Date   CERVICAL BIOPSY  W/ LOOP ELECTRODE EXCISION  11/2006   CESAREAN SECTION     X2 AND WITH BTL   COLPOSCOPY     ENDOMETRIAL ABLATION  12/2005   NOVASURE   KNEE SURGERY     TUBAL LIGATION     AT C/S    Current Outpatient Medications  Medication Sig Dispense Refill   losartan (COZAAR) 25 MG tablet Take 25 mg by mouth daily.     nitrofurantoin, macrocrystal-monohydrate, (MACROBID) 100 MG capsule TAKE 1 CAPSULE AFTER INTERCOURSE. 30 capsule 3   No current facility-administered medications for this visit.     ALLERGIES: Benzoin and Penicillins  Family History  Problem Relation Age of Onset   Hypertension Father    Hypertension Paternal Grandmother    Hypertension Paternal Grandfather     Social History   Socioeconomic  History   Marital status: Married    Spouse name: Not on file   Number of children: Not on file   Years of education: Not on file   Highest education level: Not on file  Occupational History   Not on file  Tobacco Use   Smoking status: Never   Smokeless tobacco: Never  Vaping Use   Vaping Use: Never used  Substance and Sexual Activity   Alcohol use: Yes    Alcohol/week: 0.0 standard drinks    Comment: Rare   Drug use: No   Sexual activity: Yes    Birth control/protection: Surgical    Comment: Tubal lig.-1st intercourse 7 yo-5 partners  Other Topics Concern   Not on file  Social History Narrative   Not on file   Social Determinants of Health   Financial Resource Strain: Not on file  Food Insecurity: Not on file  Transportation Needs: Not on file  Physical Activity: Not on file  Stress: Not on file  Social Connections: Not on file  Intimate Partner Violence: Not on  file    ROS  PHYSICAL EXAMINATION:    BP 132/82 (Cuff Size: Normal)     General appearance: alert, cooperative and appears stated age Breasts:  in the left breast between 2-3 o'clock a few cm from the areolar region is a stable, lima bean shaped, mobile, not tender lump. No other lumps in either breast, no skin changes. No axillary or supraclavicular adenopathy.  1. Breast lump on left side at 2 o'clock position Stable, negative imaging.  Routine f/u

## 2021-02-22 ENCOUNTER — Ambulatory Visit: Payer: No Typology Code available for payment source | Admitting: Obstetrics and Gynecology

## 2021-03-04 ENCOUNTER — Other Ambulatory Visit: Payer: Self-pay | Admitting: Internal Medicine

## 2021-03-04 DIAGNOSIS — I10 Essential (primary) hypertension: Secondary | ICD-10-CM

## 2021-04-12 ENCOUNTER — Ambulatory Visit
Admission: RE | Admit: 2021-04-12 | Discharge: 2021-04-12 | Disposition: A | Discharge status: Home / Self Care | Payer: No Typology Code available for payment source | Source: Ambulatory Visit | Attending: Internal Medicine | Admitting: Internal Medicine

## 2021-04-12 DIAGNOSIS — I10 Essential (primary) hypertension: Secondary | ICD-10-CM

## 2021-10-10 ENCOUNTER — Ambulatory Visit (INDEPENDENT_AMBULATORY_CARE_PROVIDER_SITE_OTHER): Payer: No Typology Code available for payment source | Admitting: Obstetrics and Gynecology

## 2021-10-10 ENCOUNTER — Encounter: Payer: Self-pay | Admitting: Obstetrics and Gynecology

## 2021-10-10 ENCOUNTER — Other Ambulatory Visit: Payer: Self-pay | Admitting: Obstetrics and Gynecology

## 2021-10-10 VITALS — BP 130/82 | HR 88 | Wt 137.0 lb

## 2021-10-10 DIAGNOSIS — N760 Acute vaginitis: Secondary | ICD-10-CM

## 2021-10-10 DIAGNOSIS — B9689 Other specified bacterial agents as the cause of diseases classified elsewhere: Secondary | ICD-10-CM | POA: Diagnosis not present

## 2021-10-10 DIAGNOSIS — Z87440 Personal history of urinary (tract) infections: Secondary | ICD-10-CM

## 2021-10-10 LAB — WET PREP FOR TRICH, YEAST, CLUE

## 2021-10-10 MED ORDER — METRONIDAZOLE 500 MG PO TABS: 500.0000 mg | ORAL_TABLET | Freq: Two times a day (BID) | ORAL | 0 refills | Status: DC

## 2021-10-10 NOTE — Patient Instructions (Signed)

## 2021-10-10 NOTE — Progress Notes (Signed)
GYNECOLOGY  VISIT   HPI: 51 y.o.   Married White or Caucasian Not Hispanic or Latino  female   G2P2 with No LMP recorded. Patient has had an ablation.   here for abnormal discharge and vaginal odor. She started with a thick, white d/c last week and a mild odor. No itching, burning or irritation.   GYNECOLOGIC HISTORY: No LMP recorded. Patient has had an ablation. Contraception:tubal  Menopausal hormone therapy: none         OB History     Gravida  2   Para  2   Term      Preterm      AB      Living  2      SAB      IAB      Ectopic      Multiple      Live Births                 Patient Active Problem List   Diagnosis Date Noted   Adjustment disorder 07/20/2020   Essential hypertension 07/20/2020   Upper respiratory infection 07/20/2020   Chondromalacia of right patella 08/18/2019   Chondromalacia of left knee 01/31/2018   History of cervical dysplasia     Past Medical History:  Diagnosis Date   HGSIL (high grade squamous intraepithelial dysplasia) 2008   CIN 2   Hypertension     Past Surgical History:  Procedure Laterality Date   CERVICAL BIOPSY  W/ LOOP ELECTRODE EXCISION  11/2006   CESAREAN SECTION     X2 AND WITH BTL   COLPOSCOPY     ENDOMETRIAL ABLATION  12/2005   NOVASURE   KNEE SURGERY     TUBAL LIGATION     AT C/S    Current Outpatient Medications  Medication Sig Dispense Refill   losartan (COZAAR) 25 MG tablet Take 25 mg by mouth daily.     nitrofurantoin, macrocrystal-monohydrate, (MACROBID) 100 MG capsule TAKE 1 CAPSULE AFTER INTERCOURSE. 30 capsule 3   No current facility-administered medications for this visit.     ALLERGIES: Benzoin and Penicillins  Family History  Problem Relation Age of Onset   Hypertension Father    Hypertension Paternal Grandmother    Hypertension Paternal Grandfather     Social History   Socioeconomic History   Marital status: Married    Spouse name: Not on file   Number of children: Not  on file   Years of education: Not on file   Highest education level: Not on file  Occupational History   Not on file  Tobacco Use   Smoking status: Never   Smokeless tobacco: Never  Vaping Use   Vaping Use: Never used  Substance and Sexual Activity   Alcohol use: Yes    Alcohol/week: 0.0 standard drinks of alcohol    Comment: Rare   Drug use: No   Sexual activity: Yes    Birth control/protection: Surgical    Comment: Tubal lig.-1st intercourse 67 yo-5 partners  Other Topics Concern   Not on file  Social History Narrative   Not on file   Social Determinants of Health   Financial Resource Strain: Not on file  Food Insecurity: Not on file  Transportation Needs: Not on file  Physical Activity: Not on file  Stress: Not on file  Social Connections: Not on file  Intimate Partner Violence: Not on file    Review of Systems  All other systems reviewed and are negative.  PHYSICAL EXAMINATION:    There were no vitals taken for this visit.    General appearance: alert, cooperative and appears stated age  Pelvic: External genitalia:  no lesions, mild erythema              Urethra:  normal appearing urethra with no masses, tenderness or lesions              Bartholins and Skenes: normal                 Vagina: normal appearing vagina with a slight increase in creamy, white vaginal               Cervix: no lesions               Chaperone was present for exam.  1. Acute vaginitis - WET PREP FOR TRICH, YEAST, CLUE  2. Bacterial vaginitis - metroNIDAZOLE (FLAGYL) 500 MG tablet; Take 1 tablet (500 mg total) by mouth 2 (two) times daily.  Dispense: 14 tablet; Refill: 0

## 2021-10-11 NOTE — Telephone Encounter (Signed)
Refill was sent. Please remind her to schedule an annual exam.

## 2021-10-11 NOTE — Telephone Encounter (Signed)
Medication refill request: Macrobid  Last AEX:  07-20-20 JJ  Next AEX: not scheduled  Last MMG (if hormonal medication request): n/a Refill authorized: Today, please advise.   Medication pended for #30, 0RF. Please refill if appropriate.

## 2021-10-21 ENCOUNTER — Telehealth: Payer: Self-pay | Admitting: *Deleted

## 2021-10-21 MED ORDER — FLUCONAZOLE 150 MG PO TABS: 150.0000 mg | ORAL_TABLET | Freq: Once | ORAL | 1 refills | Status: AC

## 2021-10-21 NOTE — Telephone Encounter (Signed)
Patient called c/o yeast infection itching, no odor, she asked if Rx could be sent to pharmacy? Patient completed Flagyl 500 mg dose prescribed at office visit on 10/10/21. Please advise

## 2021-10-21 NOTE — Telephone Encounter (Signed)
Patient aware Rx has been sent.

## 2022-02-15 ENCOUNTER — Other Ambulatory Visit: Payer: Self-pay | Admitting: Obstetrics and Gynecology

## 2022-02-15 DIAGNOSIS — Z1231 Encounter for screening mammogram for malignant neoplasm of breast: Secondary | ICD-10-CM

## 2022-02-17 ENCOUNTER — Ambulatory Visit
Admission: RE | Admit: 2022-02-17 | Discharge: 2022-02-17 | Disposition: A | Discharge status: Home / Self Care | Payer: No Typology Code available for payment source | Source: Ambulatory Visit | Attending: Obstetrics and Gynecology | Admitting: Obstetrics and Gynecology

## 2022-02-17 DIAGNOSIS — Z1231 Encounter for screening mammogram for malignant neoplasm of breast: Secondary | ICD-10-CM

## 2022-02-22 ENCOUNTER — Other Ambulatory Visit: Payer: Self-pay | Admitting: Obstetrics and Gynecology

## 2022-02-22 DIAGNOSIS — R928 Other abnormal and inconclusive findings on diagnostic imaging of breast: Secondary | ICD-10-CM

## 2022-03-01 ENCOUNTER — Ambulatory Visit
Admission: RE | Admit: 2022-03-01 | Discharge: 2022-03-01 | Disposition: A | Discharge status: Home / Self Care | Payer: No Typology Code available for payment source | Source: Ambulatory Visit | Attending: Obstetrics and Gynecology | Admitting: Obstetrics and Gynecology

## 2022-03-01 DIAGNOSIS — R928 Other abnormal and inconclusive findings on diagnostic imaging of breast: Secondary | ICD-10-CM

## 2022-03-07 ENCOUNTER — Ambulatory Visit (INDEPENDENT_AMBULATORY_CARE_PROVIDER_SITE_OTHER): Payer: No Typology Code available for payment source | Admitting: Obstetrics and Gynecology

## 2022-03-07 ENCOUNTER — Encounter: Payer: Self-pay | Admitting: Obstetrics and Gynecology

## 2022-03-07 VITALS — BP 128/80 | HR 65 | Wt 136.0 lb

## 2022-03-07 DIAGNOSIS — B3731 Acute candidiasis of vulva and vagina: Secondary | ICD-10-CM

## 2022-03-07 DIAGNOSIS — Z1211 Encounter for screening for malignant neoplasm of colon: Secondary | ICD-10-CM

## 2022-03-07 DIAGNOSIS — N898 Other specified noninflammatory disorders of vagina: Secondary | ICD-10-CM

## 2022-03-07 LAB — WET PREP FOR TRICH, YEAST, CLUE

## 2022-03-07 MED ORDER — FLUCONAZOLE 150 MG PO TABS: 150.0000 mg | ORAL_TABLET | Freq: Once | ORAL | 0 refills | Status: AC

## 2022-03-07 NOTE — Progress Notes (Signed)
GYNECOLOGY  VISIT   HPI: 52 y.o.   Married White or Caucasian Not Hispanic or Latino  female   G2P2 with No LMP recorded. Patient has had an ablation.   here for a 2 week h/o an increased vaginal discharge. Notices it more with intercourse.  No itching, burning, irritation or odor.  She would also like to get set up for a colonoscopy, she hasn't had one yet.   GYNECOLOGIC HISTORY: No LMP recorded. Patient has had an ablation. Contraception:tubal ligation  Menopausal hormone therapy: none         OB History     Gravida  2   Para  2   Term      Preterm      AB      Living  2      SAB      IAB      Ectopic      Multiple      Live Births                 Patient Active Problem List   Diagnosis Date Noted   Adjustment disorder 07/20/2020   Essential hypertension 07/20/2020   Upper respiratory infection 07/20/2020   Chondromalacia of right patella 08/18/2019   Chondromalacia of left knee 01/31/2018   History of cervical dysplasia     Past Medical History:  Diagnosis Date   HGSIL (high grade squamous intraepithelial dysplasia) 2008   CIN 2   Hypertension     Past Surgical History:  Procedure Laterality Date   CERVICAL BIOPSY  W/ LOOP ELECTRODE EXCISION  11/2006   CESAREAN SECTION     X2 AND WITH BTL   COLPOSCOPY     ENDOMETRIAL ABLATION  12/2005   NOVASURE   KNEE SURGERY     TUBAL LIGATION     AT C/S    Current Outpatient Medications  Medication Sig Dispense Refill   losartan (COZAAR) 25 MG tablet Take 25 mg by mouth daily.     nitrofurantoin, macrocrystal-monohydrate, (MACROBID) 100 MG capsule TAKE 1 CAPSULE AFTER INTERCOURSE. 30 capsule 0   No current facility-administered medications for this visit.     ALLERGIES: Benzoin and Penicillins  Family History  Problem Relation Age of Onset   Hypertension Father    Hypertension Paternal Grandmother    Hypertension Paternal Grandfather     Social History   Socioeconomic History    Marital status: Married    Spouse name: Not on file   Number of children: Not on file   Years of education: Not on file   Highest education level: Not on file  Occupational History   Not on file  Tobacco Use   Smoking status: Never   Smokeless tobacco: Never  Vaping Use   Vaping Use: Never used  Substance and Sexual Activity   Alcohol use: Yes    Alcohol/week: 0.0 standard drinks of alcohol    Comment: Rare   Drug use: No   Sexual activity: Yes    Birth control/protection: Surgical    Comment: Tubal lig.-1st intercourse 28 yo-5 partners  Other Topics Concern   Not on file  Social History Narrative   Not on file   Social Determinants of Health   Financial Resource Strain: Not on file  Food Insecurity: Not on file  Transportation Needs: Not on file  Physical Activity: Not on file  Stress: Not on file  Social Connections: Not on file  Intimate Partner Violence: Not on file  Review of Systems  All other systems reviewed and are negative.   PHYSICAL EXAMINATION:    BP 128/80   Pulse 65   Wt 136 lb (61.7 kg)   SpO2 100%   BMI 23.34 kg/m     General appearance: alert, cooperative and appears stated age  Pelvic: External genitalia:  no lesions              Urethra:  normal appearing urethra with no masses, tenderness or lesions              Bartholins and Skenes: normal                 Vagina: normal appearing vagina with normal color and slight amount of thin/thick ,white vaginal discharge, no lesions              Cervix: no lesions             Chaperone was present for exam.  1. Vaginal discharge - WET PREP FOR TRICH, YEAST, CLUE  2. Yeast vaginitis - fluconazole (DIFLUCAN) 150 MG tablet; Take 1 tablet (150 mg total) by mouth once for 1 dose. Take one tablet.  Repeat in 72 hours if symptoms are not completely resolved.  Dispense: 2 tablet; Refill: 0  3. Colon cancer screening - Ambulatory referral to Gastroenterology

## 2022-03-07 NOTE — Patient Instructions (Signed)

## 2022-08-09 ENCOUNTER — Other Ambulatory Visit: Payer: Self-pay

## 2022-08-09 DIAGNOSIS — Z87440 Personal history of urinary (tract) infections: Secondary | ICD-10-CM

## 2022-08-09 MED ORDER — NITROFURANTOIN MONOHYD MACRO 100 MG PO CAPS: ORAL_CAPSULE | ORAL | 1 refills | Status: DC

## 2022-08-09 NOTE — Telephone Encounter (Signed)
Pt now scheduled for AEX w/ TW on 10/17/2022.   Rx pend.

## 2022-08-09 NOTE — Telephone Encounter (Signed)
LDVM on machine per DPR that rx refills have been sent to [facility].

## 2022-08-09 NOTE — Telephone Encounter (Signed)
Pt LVM in triage line stating that she had been informed that her macrobid refill request has been denied and states that this is something that she has been prescribed for years.  Last AEX 07/20/2020--nothing scheduled.  Has been seen since for OV's but no annual.  Will send msg to appt desk to schedule.

## 2022-08-15 ENCOUNTER — Encounter: Payer: Self-pay | Admitting: Nurse Practitioner

## 2022-08-15 ENCOUNTER — Ambulatory Visit (INDEPENDENT_AMBULATORY_CARE_PROVIDER_SITE_OTHER): Payer: No Typology Code available for payment source | Admitting: Nurse Practitioner

## 2022-08-15 VITALS — BP 118/68 | HR 73 | Wt 131.0 lb

## 2022-08-15 DIAGNOSIS — B3731 Acute candidiasis of vulva and vagina: Secondary | ICD-10-CM

## 2022-08-15 DIAGNOSIS — N898 Other specified noninflammatory disorders of vagina: Secondary | ICD-10-CM

## 2022-08-15 DIAGNOSIS — Z719 Counseling, unspecified: Secondary | ICD-10-CM | POA: Diagnosis not present

## 2022-08-15 LAB — WET PREP FOR TRICH, YEAST, CLUE

## 2022-08-15 MED ORDER — FLUCONAZOLE 150 MG PO TABS: 150.0000 mg | ORAL_TABLET | ORAL | 0 refills | Status: DC

## 2022-08-15 NOTE — Progress Notes (Signed)
   Acute Office Visit  Subjective:    Patient ID: Lisa Wang, female    DOB: January 05, 1971, 52 y.o.   MRN: [id number]   HPI 52 y.o. Lisa Wang presents today for vaginal discharge and itching x 1 week. Has not tried any OTC. Also wants to discuss checking hormone levels in September when she comes for her annual exam. Amenorrheic, H/O ablation. Denies menopausal symptoms.   No LMP recorded. Patient has had an ablation.    Review of Systems  Constitutional: Negative.   Genitourinary:  Positive for vaginal discharge and vaginal pain (Itching).       Objective:    Physical Exam Constitutional:      Appearance: Normal appearance.  Genitourinary:    General: Normal vulva.     Vagina: Vaginal discharge present. No erythema.     Cervix: Normal.     BP 118/68   Pulse 73   Wt 131 lb (59.4 kg)   SpO2 100%   BMI 22.49 kg/m  Wt Readings from Last 3 Encounters:  08/15/22 131 lb (59.4 kg)  03/07/22 136 lb (61.7 kg)  10/10/21 137 lb (62.1 kg)        Patient informed chaperone available to be present for breast and/or pelvic exam. Patient has requested no chaperone to be present. Patient has been advised what will be completed during breast and pelvic exam.   Wet prep + yeast  Assessment & Plan:   Problem List Items Addressed This Visit   None Visit Diagnoses     Vaginal candidiasis    -  Primary   Relevant Medications   fluconazole (DIFLUCAN) 150 MG tablet   Vaginal itching       Relevant Orders   WET PREP FOR TRICH, YEAST, CLUE   Encounter for education          Plan: Wet prep positive for yeast - Diflucan 150 mg today and repeat in 3 days if symptoms persist.  Discussed unreliability of hormones during perimenopause, so serial labs recommended if she decides to pursue. Will readdress at annual visit.       Olivia Mackie DNP, 9:50 AM 08/15/2022

## 2022-10-17 ENCOUNTER — Ambulatory Visit: Payer: No Typology Code available for payment source | Admitting: Nurse Practitioner

## 2022-11-02 ENCOUNTER — Ambulatory Visit (INDEPENDENT_AMBULATORY_CARE_PROVIDER_SITE_OTHER): Payer: No Typology Code available for payment source | Admitting: Nurse Practitioner

## 2022-11-02 ENCOUNTER — Other Ambulatory Visit (HOSPITAL_COMMUNITY)
Admission: RE | Admit: 2022-11-02 | Discharge: 2022-11-02 | Disposition: A | Discharge status: Home / Self Care | Payer: No Typology Code available for payment source | Source: Ambulatory Visit | Attending: Nurse Practitioner | Admitting: Nurse Practitioner

## 2022-11-02 ENCOUNTER — Encounter: Payer: Self-pay | Admitting: Nurse Practitioner

## 2022-11-02 VITALS — BP 132/84 | HR 65 | Ht 63.5 in | Wt 131.0 lb

## 2022-11-02 DIAGNOSIS — Z124 Encounter for screening for malignant neoplasm of cervix: Secondary | ICD-10-CM | POA: Diagnosis present

## 2022-11-02 DIAGNOSIS — Z87440 Personal history of urinary (tract) infections: Secondary | ICD-10-CM

## 2022-11-02 DIAGNOSIS — N898 Other specified noninflammatory disorders of vagina: Secondary | ICD-10-CM

## 2022-11-02 DIAGNOSIS — N912 Amenorrhea, unspecified: Secondary | ICD-10-CM | POA: Diagnosis not present

## 2022-11-02 DIAGNOSIS — Z1211 Encounter for screening for malignant neoplasm of colon: Secondary | ICD-10-CM

## 2022-11-02 DIAGNOSIS — Z01419 Encounter for gynecological examination (general) (routine) without abnormal findings: Secondary | ICD-10-CM | POA: Diagnosis not present

## 2022-11-02 DIAGNOSIS — B3731 Acute candidiasis of vulva and vagina: Secondary | ICD-10-CM

## 2022-11-02 LAB — WET PREP FOR TRICH, YEAST, CLUE

## 2022-11-02 MED ORDER — FLUCONAZOLE 150 MG PO TABS
150.0000 mg | ORAL_TABLET | ORAL | 0 refills | Status: DC
Start: 2022-11-02 — End: 2022-12-19

## 2022-11-02 MED ORDER — NITROFURANTOIN MONOHYD MACRO 100 MG PO CAPS
ORAL_CAPSULE | ORAL | 1 refills | Status: DC
Start: 2022-11-02 — End: 2023-08-09

## 2022-11-02 NOTE — Progress Notes (Signed)
Lisa Wang Jun 01, 1970 [id number]   History:  52 y.o. G2P2 presents for annual exam. Amenorrheic. H/O ablation. Would like hormones checked today. Denies menopausal symptoms but just wants to know if she is menopausal. 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 Complains of vaginal discharge without itching or odor. H/O postcoital UTIs, takes Macrobid as needed with intercourse.   Gynecologic History No LMP recorded. Patient has had an ablation.   Contraception/Family planning: tubal ligation Sexually active: Yes  Health Maintenance Last Pap: 07/18/2018. Results were: Normal neg HPV Last mammogram: 02/17/2022. Results were: Possible left breast mass, follow up imaging showed benign cyst Last colonoscopy: Never Last Dexa: Not indicated  Past medical history, past surgical history, family history and social history were all reviewed and documented in the EPIC chart. Married. 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 14 yo daughter, at [facility] for sports medicine, thinking nursing. 11 yo son.   ROS:  A ROS was performed and pertinent positives and negatives are included.  Exam:  Vitals:   11/02/22 1452  BP: 132/84  Pulse: 65  SpO2: 99%  Weight: 131 lb (59.4 kg)  Height: 5' 3.5" (1.613 m)   Body mass index is 22.84 kg/m. Physical Exam  General appearance:  Normal Thyroid:  Symmetrical, normal in size, without palpable masses or nodularity. Respiratory  Auscultation:  Clear without wheezing or rhonchi Cardiovascular  Auscultation:  Regular rate, without rubs, murmurs or gallops  Edema/varicosities:  Not grossly evident Abdominal  Soft,nontender, without masses, guarding or rebound.  Liver/spleen:  No organomegaly noted  Hernia:  None appreciated  Skin  Inspection:  Grossly normal Breasts: Examined lying and sitting.   Right: Without masses, retractions, nipple discharge or axillary adenopathy.   Left: Without masses, retractions, nipple discharge or axillary adenopathy. Pelvic: External genitalia:  no  lesions              Urethra:  normal appearing urethra with no masses, tenderness or lesions              Bartholins and Skenes: normal                 Vagina: White, chunky discharge, no erythema              Cervix: no lesions Bimanual Exam:  Uterus:  no masses or tenderness              Adnexa: no mass, fullness, tenderness              Rectovaginal: Deferred              Anus:  normal, no lesions   Patient informed chaperone available to be present for breast and pelvic exam. Patient has requested no chaperone to be present. Patient has been advised what will be completed during breast and pelvic exam.   Wet prep + yeast  Assessment/Plan:  52 y.o. G2P2 for annual exam.   Well female exam with routine gynecological exam - Education provided on SBEs, importance of preventative screenings, current guidelines, high calcium diet, regular exercise, and multivitamin daily.  Labs with PCP.   Cervical cancer screening - Plan: Cytology - PAP( [facility]) . Normal pap history.   Screening for colon cancer - Plan: Cologuard. Discussed current guidelines and importance of preventative screenings. Colonoscopy versus Cologuard reviewed. Average risk. Would like Cologuard.   Amenorrhea - Plan: Estradiol, Follicle stimulating hormone  Vaginal discharge - Plan: WET PREP FOR TRICH, YEAST, CLUE. + yeast.  Vaginal candidiasis - Plan: fluconazole (  DIFLUCAN) 150 MG tablet today and repeat in 3 days for total of 2 doses.   History of recurrent UTI (urinary tract infection) - Plan: nitrofurantoin, macrocrystal-monohydrate, (MACROBID) 100 MG capsule as needed with intercourse. Good management.   Screening for breast cancer - Normal mammogram history.  Continue annual screenings.  Normal breast exam today.  Return in about 1 year (around 11/02/2023) for Annual.   Olivia Mackie DNP, 3:49 PM 11/02/2022

## 2022-11-03 LAB — CYTOLOGY - PAP
Adequacy: ABSENT
Comment: NEGATIVE
Diagnosis: NEGATIVE
High risk HPV: NEGATIVE

## 2022-11-03 LAB — ESTRADIOL: Estradiol: 330 pg/mL

## 2022-11-03 LAB — FOLLICLE STIMULATING HORMONE: FSH: 11.6 m[IU]/mL

## 2022-12-18 ENCOUNTER — Telehealth: Payer: Self-pay | Admitting: *Deleted

## 2022-12-18 NOTE — Telephone Encounter (Signed)
Call returned to patient. Patient reports thin white vaginal d/c and vaginal itching only when exercising. Completed the two doses of Diflucan prescribed on 11/02/22, symptoms initially resolved. Denies any other symptoms. Patient states she was advised to return call to office for treatment if symptoms return. Pharmacy updated. Advised will forward to TW to review and our office will f/u with recommendations.   Patient agreeable.

## 2022-12-19 ENCOUNTER — Other Ambulatory Visit: Payer: Self-pay | Admitting: Nurse Practitioner

## 2022-12-19 DIAGNOSIS — B3731 Acute candidiasis of vulva and vagina: Secondary | ICD-10-CM

## 2022-12-19 MED ORDER — FLUCONAZOLE 150 MG PO TABS
150.0000 mg | ORAL_TABLET | ORAL | 0 refills | Status: DC
Start: 2022-12-19 — End: 2023-08-14

## 2022-12-19 NOTE — Telephone Encounter (Signed)
Diflucan sent to pharmacy. If symptoms persist or return after treatment an OV is recommended. Thanks.

## 2022-12-20 NOTE — Telephone Encounter (Signed)
Patient notified. Encounter closed

## 2023-02-01 LAB — COLOGUARD: COLOGUARD: NEGATIVE

## 2023-02-19 ENCOUNTER — Other Ambulatory Visit: Payer: Self-pay | Admitting: Nurse Practitioner

## 2023-02-19 DIAGNOSIS — Z1231 Encounter for screening mammogram for malignant neoplasm of breast: Secondary | ICD-10-CM

## 2023-03-20 ENCOUNTER — Ambulatory Visit: Payer: No Typology Code available for payment source

## 2023-04-02 ENCOUNTER — Ambulatory Visit
Admission: RE | Admit: 2023-04-02 | Discharge: 2023-04-02 | Disposition: A | Discharge status: Home / Self Care | Payer: No Typology Code available for payment source | Source: Ambulatory Visit | Attending: Nurse Practitioner

## 2023-04-02 DIAGNOSIS — Z1231 Encounter for screening mammogram for malignant neoplasm of breast: Secondary | ICD-10-CM

## 2023-06-16 IMAGING — CT CT CARDIAC CORONARY ARTERY CALCIUM SCORE
3 series · 14 of 20 positions shown, 16 images · non-contrast
Comparison: None.

CLINICAL DATA: 50-year-old Caucasian female with history of
hypertension and family history of heart disease.

EXAM:
CT CARDIAC CORONARY ARTERY CALCIUM SCORE
TECHNIQUE: Non-contrast imaging through the heart was performed using
prospective ECG gating. Image post processing was performed on an
independent workstation, allowing for quantitative analysis of the
heart and coronary arteries. Note that this exam targets the heart
and the chest was not imaged in its entirety.
RADIATION DOSE REDUCTION: This exam was performed according to the
departmental dose-optimization program which includes automated
exposure control, adjustment of the mA and/or kV according to
patient size and/or use of iterative reconstruction technique.

[Series 2: calcium scoring 2.00 qr36 bestdiast 69% hrt calciu · axial · 0.35mm/px · z∈[+1474,+1570]mm · 4 of 80 slices shown]
[im 16/80  vessel]
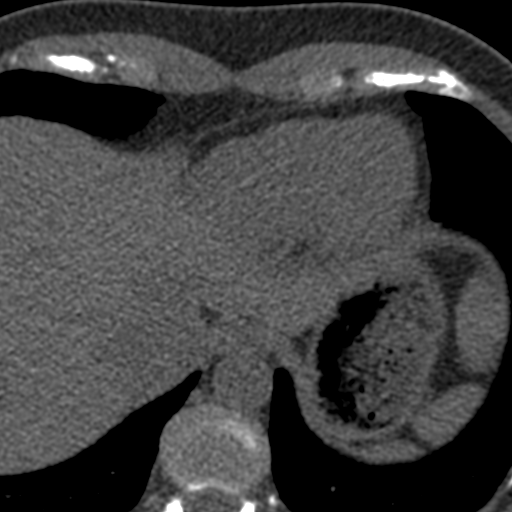
[im 32/80  vessel]
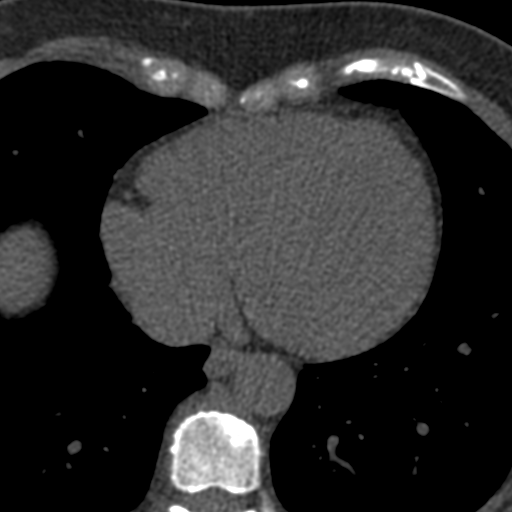
[im 48/80  vessel]
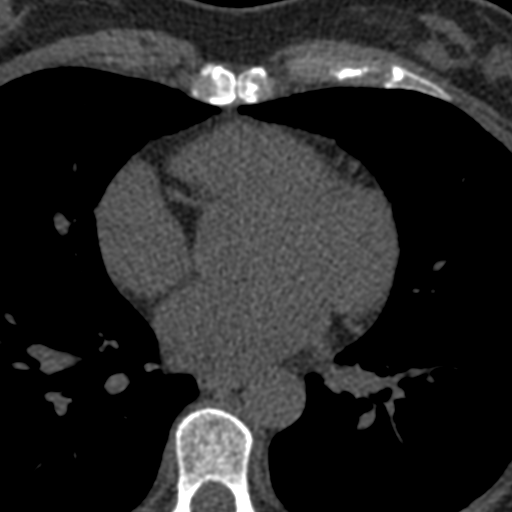
[im 64/80  vessel]
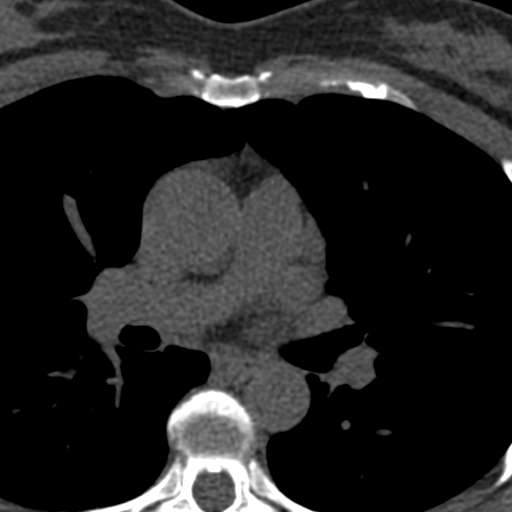

[Series 4: calcium scoring 2.00 br40 bestdiast 69% axial · axial · 0.54mm/px · z∈[+1470,+1574]mm · 5 of 80 slices shown, 7 images]
[im 14/80  vessel]
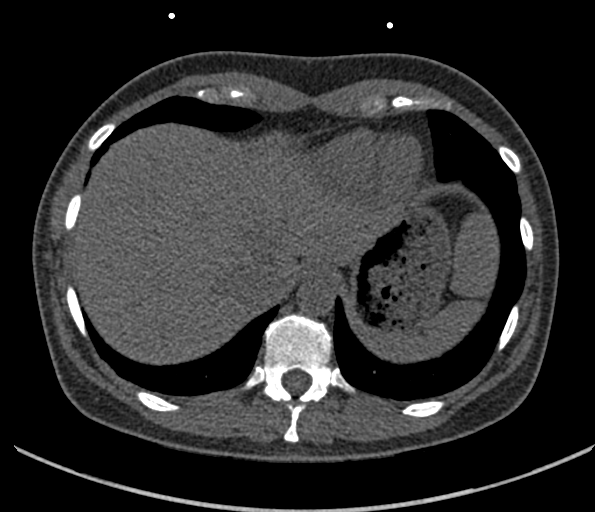
[im 14/80  lung]
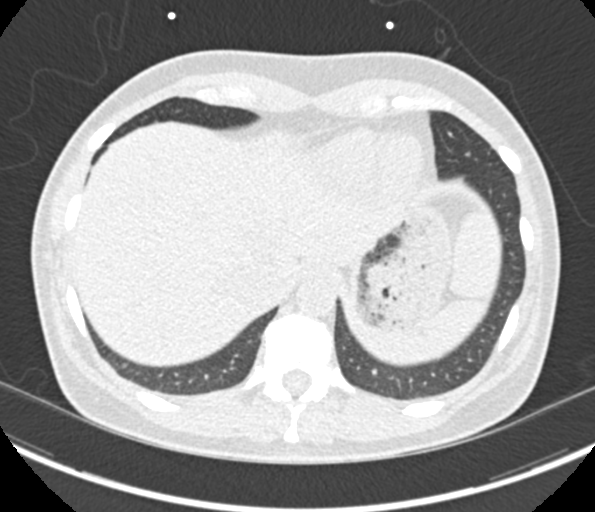
[im 27/80  vessel]
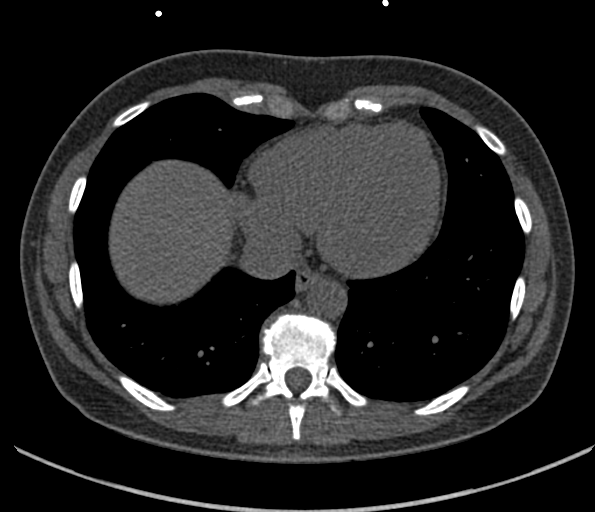
[im 40/80  vessel]
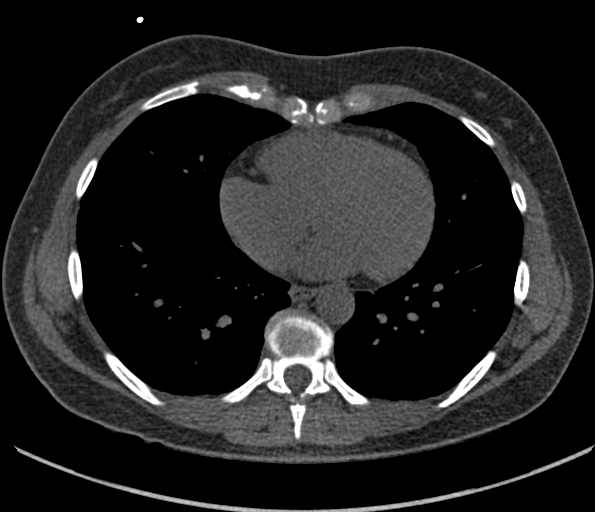
[im 53/80  vessel]
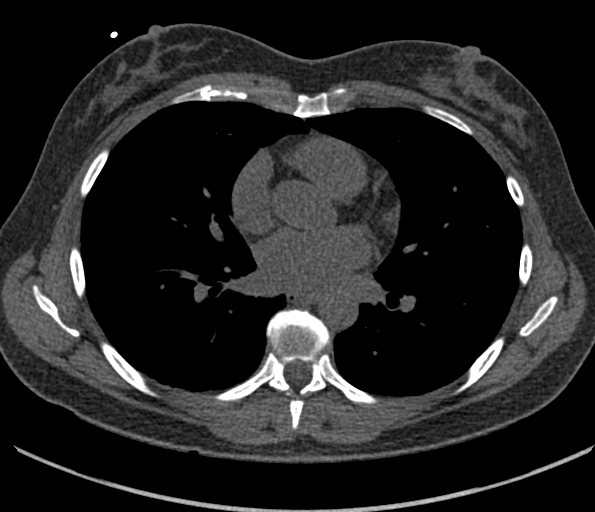
[im 66/80  vessel]
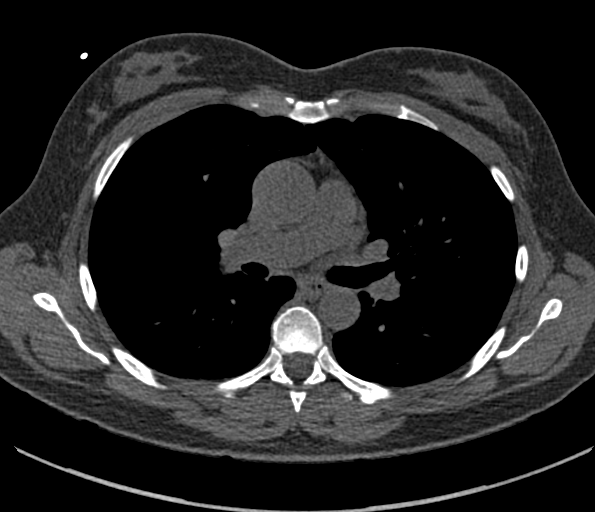
[im 66/80  lung]
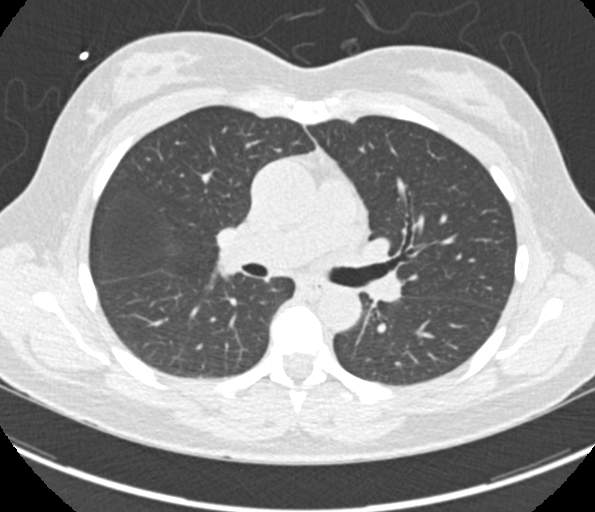

[Series 10: calcium scoring 2.00 br60 bestdiast 69% lungs · axial · 0.49mm/px · z∈[+1470,+1574]mm · 5 of 80 slices shown]
[im 14/80  vessel]
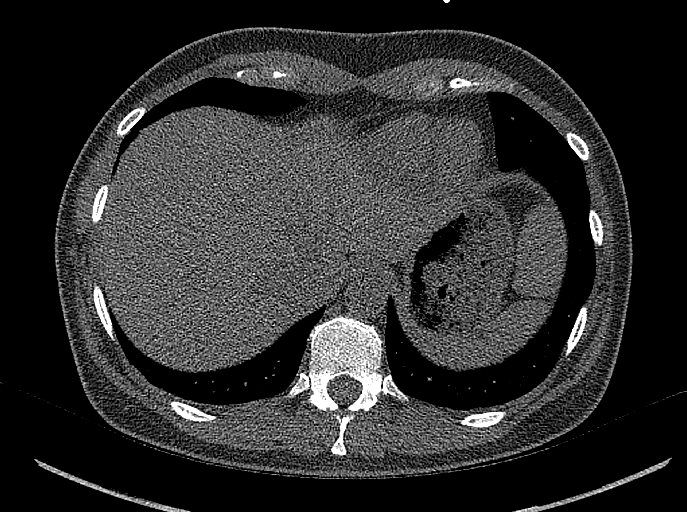
[im 27/80  vessel]
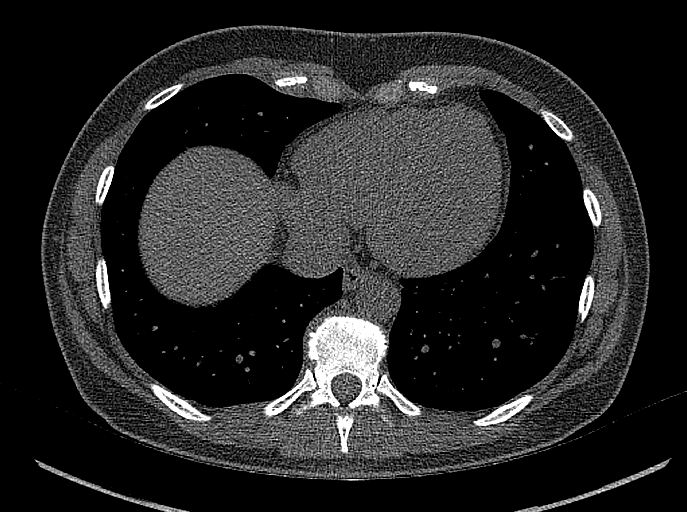
[im 40/80  vessel]
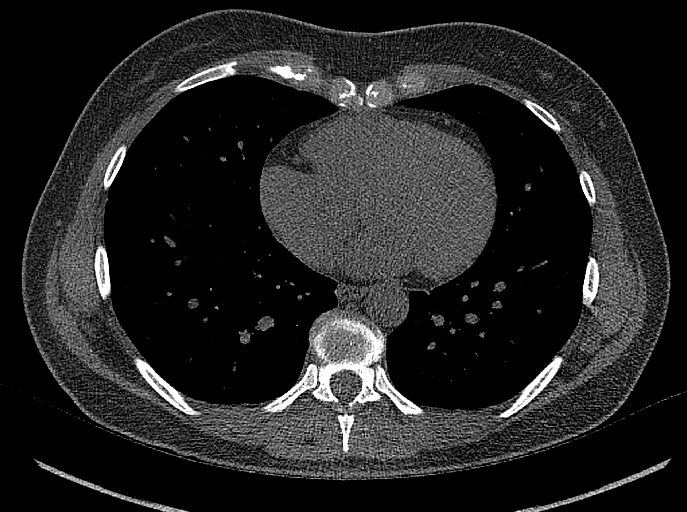
[im 53/80  vessel]
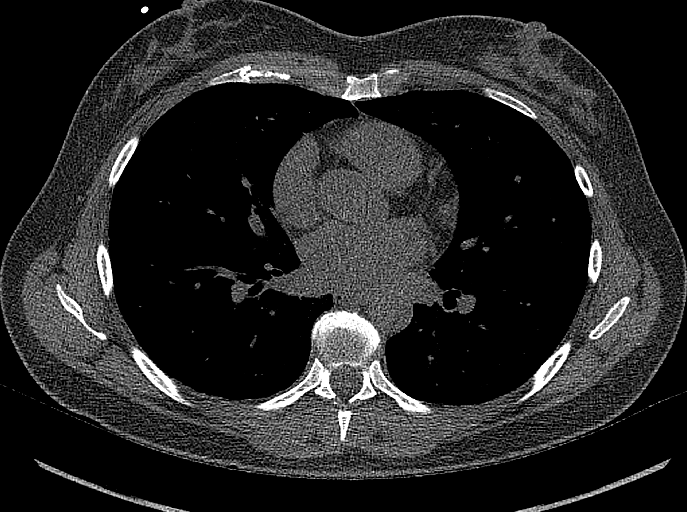
[im 66/80  vessel]
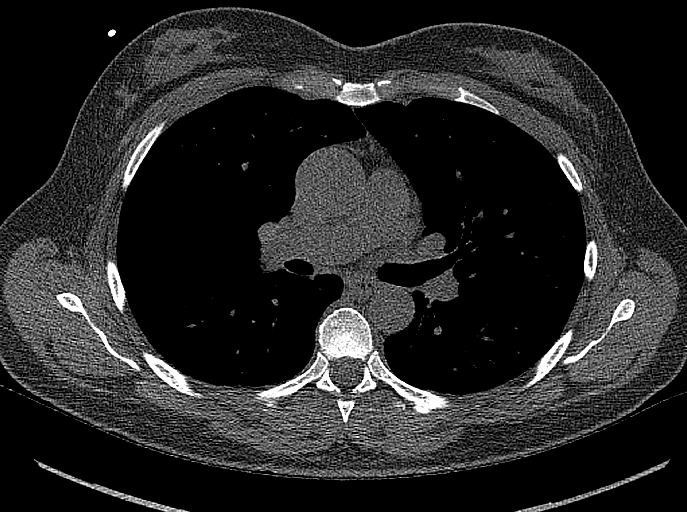

[14 of 20 positions shown; findings below may reference images not displayed]

FINDINGS: CORONARY CALCIUM SCORES:

Left Main: 0

LAD: 0

LCx: 0

RCA:

Total Agatston Score:

[HOSPITAL] percentile: 88

AORTA MEASUREMENTS:

Ascending Aorta: 32 mm

Descending Aorta: 21 mm

OTHER FINDINGS:

The heart size is within normal limits. No pericardial fluid is
identified. Visualized segments of the thoracic aorta and central
pulmonary arteries are normal in caliber. Visualized mediastinum and
hilar regions demonstrate no lymphadenopathy or masses. Visualized
lungs show no evidence of pulmonary edema, consolidation,
pneumothorax, nodule or pleural fluid. Visualized upper abdomen and
bony structures are unremarkable.
IMPRESSION: Coronary calcium score 2.9 is at the 88th percentile for the
patient's age, sex and race.

## 2023-08-09 ENCOUNTER — Other Ambulatory Visit: Payer: Self-pay

## 2023-08-09 DIAGNOSIS — Z87440 Personal history of urinary (tract) infections: Secondary | ICD-10-CM

## 2023-08-09 MED ORDER — NITROFURANTOIN MONOHYD MACRO 100 MG PO CAPS
ORAL_CAPSULE | ORAL | 0 refills | Status: DC
Start: 2023-08-09 — End: 2023-11-21

## 2023-08-09 NOTE — Telephone Encounter (Signed)
 Med refill request: Macrobid  100 mg Last AEX: 11/02/22 Next AEX: none scheduled Last MMG (if hormonal med) n/a Refill authorized: Please Advise?

## 2023-08-13 ENCOUNTER — Encounter: Payer: Self-pay | Admitting: Nurse Practitioner

## 2023-08-14 ENCOUNTER — Other Ambulatory Visit: Payer: Self-pay | Admitting: Nurse Practitioner

## 2023-08-14 DIAGNOSIS — B3731 Acute candidiasis of vulva and vagina: Secondary | ICD-10-CM

## 2023-08-14 MED ORDER — FLUCONAZOLE 150 MG PO TABS
150.0000 mg | ORAL_TABLET | ORAL | 0 refills | Status: DC
Start: 2023-08-14 — End: 2023-08-29

## 2023-08-29 ENCOUNTER — Ambulatory Visit: Admitting: Nurse Practitioner

## 2023-08-29 ENCOUNTER — Encounter: Payer: Self-pay | Admitting: Nurse Practitioner

## 2023-08-29 VITALS — BP 110/74 | HR 70

## 2023-08-29 DIAGNOSIS — N951 Menopausal and female climacteric states: Secondary | ICD-10-CM | POA: Diagnosis not present

## 2023-08-29 DIAGNOSIS — N898 Other specified noninflammatory disorders of vagina: Secondary | ICD-10-CM

## 2023-08-29 DIAGNOSIS — R232 Flushing: Secondary | ICD-10-CM

## 2023-08-29 DIAGNOSIS — R4189 Other symptoms and signs involving cognitive functions and awareness: Secondary | ICD-10-CM | POA: Diagnosis not present

## 2023-08-29 MED ORDER — PROGESTERONE MICRONIZED 100 MG PO CAPS
100.0000 mg | ORAL_CAPSULE | Freq: Every day | ORAL | 1 refills | Status: DC
Start: 2023-08-29 — End: 2023-10-01

## 2023-08-29 MED ORDER — ESTRADIOL 0.025 MG/24HR TD PTTW: 1.0000 | MEDICATED_PATCH | TRANSDERMAL | 1 refills | Status: DC

## 2023-08-29 NOTE — Progress Notes (Signed)
   Acute Office Visit  Subjective:    Patient ID: Lisa Wang, female    DOB: Apr 23, 1970, 53 y.o.   MRN: [id number]   HPI 53 y.o. presents today for pelvic cramping, bloating, mood changes, brain fog and facial flushing. Feels she is having significant menopausal symptoms. Amenorrheic d/t ablation. Denies vaginal, urinary or other GI symptoms. No changes in bowels or diet. Bloating is daily. Cramping is intermittent and feels like menstrual cramps. H/O HTN, controlled. Has appointment with ADHD provider soon.   No LMP recorded. Patient has had an ablation.    Review of Systems  Constitutional: Negative.   Gastrointestinal:  Positive for abdominal distention (Bloating) and abdominal pain (Cramping). Negative for constipation, diarrhea and nausea.  Endocrine: Positive for heat intolerance.  Genitourinary:  Negative for dyspareunia, flank pain, frequency, hematuria, menstrual problem, urgency, vaginal discharge and vaginal pain.  Psychiatric/Behavioral:  Positive for agitation, decreased concentration and sleep disturbance.        Objective:    Physical Exam Constitutional:      Appearance: Normal appearance.  Genitourinary:    General: Normal vulva.     Vagina: Normal.     Cervix: Normal.     BP 110/74   Pulse 70   SpO2 99%  Wt Readings from Last 3 Encounters:  11/02/22 131 lb (59.4 kg)  08/15/22 131 lb (59.4 kg)  03/07/22 136 lb (61.7 kg)        Wet prep negative for pathogens  Assessment & Plan:   Problem List Items Addressed This Visit   None Visit Diagnoses       Perimenopausal symptoms    -  Primary   Relevant Medications   progesterone  (PROMETRIUM ) 100 MG capsule   estradiol  (VIVELLE -DOT) 0.025 MG/24HR (Start on 08/30/2023)     Vaginal discharge     Wet prep     Brain fog         Hot flashes       Relevant Medications         estradiol  (VIVELLE -DOT) 0.025 MG/24HR (Start on 08/30/2023)      Plan: Wet prep negative. Discussed management options with  SSRIs/SNRIs, HRT, OTC supplements. Start Mag L-threonate. Would like to start HRT. Educated on risks, benefits and proper use. No CI for HRT.   Return in about 4 weeks (around 09/26/2023) for Med follow up.    [name] DNP, 8:36 AM 08/30/2023

## 2023-08-30 LAB — WET PREP FOR TRICH, YEAST, CLUE

## 2023-08-30 NOTE — Addendum Note (Signed)
 Addended by: PRENTISS RIGGS on: 08/30/2023 08:36 AM   Modules accepted: Orders

## 2023-09-26 ENCOUNTER — Ambulatory Visit: Admitting: Nurse Practitioner

## 2023-10-01 ENCOUNTER — Encounter: Payer: Self-pay | Admitting: Nurse Practitioner

## 2023-10-01 ENCOUNTER — Ambulatory Visit: Admitting: Nurse Practitioner

## 2023-10-01 VITALS — BP 112/70 | HR 66 | Ht 64.0 in | Wt 139.0 lb

## 2023-10-01 DIAGNOSIS — N760 Acute vaginitis: Secondary | ICD-10-CM | POA: Diagnosis not present

## 2023-10-01 DIAGNOSIS — N951 Menopausal and female climacteric states: Secondary | ICD-10-CM | POA: Diagnosis not present

## 2023-10-01 DIAGNOSIS — Z79890 Hormone replacement therapy (postmenopausal): Secondary | ICD-10-CM | POA: Diagnosis not present

## 2023-10-01 MED ORDER — ESTRADIOL 0.0375 MG/24HR TD PTTW: 1.0000 | MEDICATED_PATCH | TRANSDERMAL | 1 refills | Status: DC

## 2023-10-01 MED ORDER — FLUCONAZOLE 150 MG PO TABS: 150.0000 mg | ORAL_TABLET | ORAL | 1 refills | Status: DC

## 2023-10-01 MED ORDER — PROGESTERONE MICRONIZED 100 MG PO CAPS: 100.0000 mg | ORAL_CAPSULE | Freq: Every day | ORAL | 0 refills | Status: DC

## 2023-10-01 NOTE — Progress Notes (Signed)
   Acute Office Visit  Subjective:    Patient ID: Lisa Wang, female    DOB: Feb 05, 1970, 53 y.o.   MRN: [id number]   HPI 53 y.o. presents today for 4-week med follow up. Started HRT for bloating, mood changes, brain fog and facial flushing. Prometrium  nightly, 0.025 mg twice weekly estradiol  patch. Felt much better the first couple of weeks but then symptoms came back. Doubled up on patch last week and felt good. On antibiotics for ear infection and feels like she has another yeast infection.   No LMP recorded. Patient has had an ablation.    Review of Systems  Constitutional: Negative.   Endocrine: Positive for heat intolerance.  Psychiatric/Behavioral:  Positive for agitation and decreased concentration.        Objective:    Physical Exam Constitutional:      Appearance: Normal appearance.     BP 112/70 (BP Location: Left Arm, Patient Position: Sitting, Cuff Size: Normal)   Pulse 66   Ht 5' 4 (1.626 m)   Wt 139 lb (63 kg)   SpO2 100%   BMI 23.86 kg/m  Wt Readings from Last 3 Encounters:  10/01/23 139 lb (63 kg)  11/02/22 131 lb (59.4 kg)  08/15/22 131 lb (59.4 kg)        Assessment & Plan:   Problem List Items Addressed This Visit   None Visit Diagnoses       Hormone replacement therapy    -  Primary   Relevant Medications   estradiol  (VIVELLE -DOT) 0.0375 MG/24HR   progesterone  (PROMETRIUM ) 100 MG capsule     Perimenopausal symptoms       Relevant Medications   estradiol  (VIVELLE -DOT) 0.0375 MG/24HR   progesterone  (PROMETRIUM ) 100 MG capsule     Acute vaginitis       Relevant Medications   fluconazole  (DIFLUCAN ) 150 MG tablet      Plan: Increase patch to 0.0375 mg twice weekly. Continue Prometrium  nightly. Diflucan  #2 provided.   Return if symptoms worsen or fail to improve.    Lisa DELENA Shutter DNP, 1:41 PM 10/01/2023

## 2023-10-25 ENCOUNTER — Encounter: Payer: Self-pay | Admitting: Nurse Practitioner

## 2023-10-25 NOTE — Telephone Encounter (Signed)
 Spoke with patient, AEX scheduled for 11/21/23 at 0900.   Encounter closed.

## 2023-10-25 NOTE — Telephone Encounter (Signed)
 OV 10/01/23 Next AEX not scheduled.  Last AEX 11/02/22   Tiffany -please review. If f/u required can schedule with AEX

## 2023-11-15 ENCOUNTER — Ambulatory Visit: Admitting: Nurse Practitioner

## 2023-11-20 NOTE — Progress Notes (Unsigned)
 Lisa Wang 09-Dec-1970 [id number]   History:  53 y.o. G2P2 presents for annual exam. Started HRT for perimenopausal symptoms in August. Has noticed improvement but feels hot flashes have come back the last couple of weeks. Still feeling fatigued. Having trouble getting up in the morning. Anxiety is a little better but still struggling most days, feels like she is always on edge. Not interested in SSRI/SNRI due to risk for weight gain and decreased libido. 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  H/O postcoital UTIs, takes Macrobid  as needed with intercourse.   Gynecologic History No LMP recorded. Patient has had an ablation.   Contraception/Family planning: tubal ligation Sexually active: Yes  Health Maintenance Last Pap: 11/02/2022. Results were: Normal neg HPV Last mammogram: 04/02/2023. Results were: Normal Last colonoscopy: Never. Neg Cologuard 01/29/2023 Last Dexa: Not indicated     11/21/2023    8:55 AM  Depression screen PHQ 2/9  Decreased Interest 0  Down, Depressed, Hopeless 0  PHQ - 2 Score 0     Past medical history, past surgical history, family history and social history were all reviewed and documented in the EPIC chart. Married. 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 52 yo daughter, at [employer] for sports medicine, thinking nursing. 52 yo son.   ROS:  A ROS was performed and pertinent positives and negatives are included.  Exam:  Vitals:   11/21/23 0851  BP: 118/64  Pulse: 76  Resp: 16  Weight: 134 lb (60.8 kg)  Height: 5' 3.75 (1.619 m)    Body mass index is 23.18 kg/m.   General appearance:  Normal Thyroid:  Symmetrical, normal in size, without palpable masses or nodularity. Respiratory  Auscultation:  Clear without wheezing or rhonchi Cardiovascular  Auscultation:  Regular rate, without rubs, murmurs or gallops  Edema/varicosities:  Not grossly evident Abdominal  Soft,nontender, without masses, guarding or rebound.  Liver/spleen:  No organomegaly noted  Hernia:  None  appreciated  Skin  Inspection:  Grossly normal Breasts: Examined lying and sitting.   Right: Without masses, retractions, nipple discharge or axillary adenopathy.   Left: Without masses, retractions, nipple discharge or axillary adenopathy. Pelvic: External genitalia:  no lesions              Urethra:  normal appearing urethra with no masses, tenderness or lesions              Bartholins and Skenes: normal                 Vagina: White, chunky discharge, no erythema              Cervix: no lesions Bimanual Exam:  Uterus:  no masses or tenderness              Adnexa: no mass, fullness, tenderness              Rectovaginal: Deferred              Anus:  normal, no lesions  [name], CMA present as chaperone.   Assessment/Plan:  53 y.o. G2P2 for annual exam.   Well female exam with routine gynecological exam - Education provided on SBEs, importance of preventative screenings, current guidelines, high calcium diet, regular exercise, and multivitamin daily.  Labs with PCP.   Hormone replacement therapy - Plan: progesterone  (PROMETRIUM ) 100 MG capsule nightly, estradiol  (VIVELLE -DOT) 0.05 MG/24HR patch twice weekly. Increase estradiol .   Low libido - Plan: Testos,Total,Free and SHBG (Female). Would like testosterone levels checked today. Briefly discussed management  with compounded testosterone. Aware not FDA approved. Discussed benefits and potential side effects.   Fatigue, unspecified type - Plan: Testos,Total,Free and SHBG (Female). Likely s/t menopause. Has noticed some improvement since starting HRT.   Anxiety - Plan: buPROPion (WELLBUTRIN XL) 150 MG 24 hr tablet daily. Has had some improvement since starting HRT but still having anxiety and feeling on edge most days.   History of recurrent UTI (urinary tract infection) - Plan: nitrofurantoin , macrocrystal-monohydrate, (MACROBID ) 100 MG capsule as needed with intercourse. Good management.   Cervical cancer screening - Normal pap  history. Will repeat at 3-year interval per guidelines.   Screening for colon cancer - Negative Cologuard 01/2023  Screening for breast cancer - Normal mammogram history.  Continue annual screenings.  Normal breast exam today.  Return in about 1 year (around 11/20/2024) for Annual.   Lisa DELENA Shutter DNP, 9:23 AM 11/21/2023

## 2023-11-21 ENCOUNTER — Ambulatory Visit (INDEPENDENT_AMBULATORY_CARE_PROVIDER_SITE_OTHER): Admitting: Nurse Practitioner

## 2023-11-21 ENCOUNTER — Encounter: Payer: Self-pay | Admitting: Nurse Practitioner

## 2023-11-21 VITALS — BP 118/64 | HR 76 | Resp 16 | Ht 63.75 in | Wt 134.0 lb

## 2023-11-21 DIAGNOSIS — Z87440 Personal history of urinary (tract) infections: Secondary | ICD-10-CM

## 2023-11-21 DIAGNOSIS — Z1331 Encounter for screening for depression: Secondary | ICD-10-CM

## 2023-11-21 DIAGNOSIS — Z79890 Hormone replacement therapy (postmenopausal): Secondary | ICD-10-CM | POA: Diagnosis not present

## 2023-11-21 DIAGNOSIS — R6882 Decreased libido: Secondary | ICD-10-CM

## 2023-11-21 DIAGNOSIS — Z01419 Encounter for gynecological examination (general) (routine) without abnormal findings: Secondary | ICD-10-CM | POA: Diagnosis not present

## 2023-11-21 DIAGNOSIS — F419 Anxiety disorder, unspecified: Secondary | ICD-10-CM

## 2023-11-21 DIAGNOSIS — R5383 Other fatigue: Secondary | ICD-10-CM

## 2023-11-21 DIAGNOSIS — Z124 Encounter for screening for malignant neoplasm of cervix: Secondary | ICD-10-CM

## 2023-11-21 DIAGNOSIS — N951 Menopausal and female climacteric states: Secondary | ICD-10-CM

## 2023-11-21 MED ORDER — ESTRADIOL 0.05 MG/24HR TD PTTW: 1.0000 | MEDICATED_PATCH | TRANSDERMAL | 3 refills | Status: AC

## 2023-11-21 MED ORDER — PROGESTERONE MICRONIZED 100 MG PO CAPS: 100.0000 mg | ORAL_CAPSULE | Freq: Every day | ORAL | 3 refills | Status: AC

## 2023-11-21 MED ORDER — NITROFURANTOIN MONOHYD MACRO 100 MG PO CAPS: ORAL_CAPSULE | ORAL | 1 refills | Status: AC

## 2023-11-21 MED ORDER — BUPROPION HCL ER (XL) 150 MG PO TB24
150.0000 mg | ORAL_TABLET | Freq: Every day | ORAL | 1 refills | Status: AC
Start: 2023-11-21 — End: ?

## 2023-11-27 ENCOUNTER — Ambulatory Visit: Payer: Self-pay | Admitting: Nurse Practitioner

## 2023-11-27 LAB — TESTOS,TOTAL,FREE AND SHBG (FEMALE)
Free Testosterone: 4 pg/mL (ref 0.1–6.4)
Sex Hormone Binding: 99 nmol/L (ref 17–124)
Testosterone, Total, LC-MS-MS: 45 ng/dL (ref 2–45)

## 2023-12-13 ENCOUNTER — Other Ambulatory Visit: Payer: Self-pay | Admitting: Nurse Practitioner

## 2023-12-13 DIAGNOSIS — N760 Acute vaginitis: Secondary | ICD-10-CM

## 2023-12-13 NOTE — Telephone Encounter (Signed)
 Med refill request:fluconazole  (DIFLUCAN )  Last AEX: 11/21/23 Next AEX: not scheduled  Last MMG (if hormonal med) Refill authorized: last rx 10/01/23 #2 with 1 refill. Please advise

## 2023-12-19 ENCOUNTER — Ambulatory Visit: Admitting: Nurse Practitioner

## 2023-12-19 VITALS — BP 134/86 | HR 74 | Wt 138.0 lb

## 2023-12-19 DIAGNOSIS — B9689 Other specified bacterial agents as the cause of diseases classified elsewhere: Secondary | ICD-10-CM | POA: Diagnosis not present

## 2023-12-19 DIAGNOSIS — N760 Acute vaginitis: Secondary | ICD-10-CM | POA: Diagnosis not present

## 2023-12-19 DIAGNOSIS — N898 Other specified noninflammatory disorders of vagina: Secondary | ICD-10-CM

## 2023-12-19 LAB — WET PREP FOR TRICH, YEAST, CLUE

## 2023-12-19 MED ORDER — METRONIDAZOLE 0.75 % VA GEL: 1.0000 | Freq: Every day | VAGINAL | 0 refills | Status: AC

## 2023-12-19 NOTE — Progress Notes (Signed)
   Acute Office Visit  Subjective:    Patient ID: Lisa Wang, female    DOB: 07-02-1970, 53 y.o.   MRN: [id number]   HPI 53 y.o. presents today for vaginal discharge and itching x 1 week. No odor.   No LMP recorded. Patient has had an ablation.    Review of Systems  Constitutional: Negative.   Genitourinary:  Positive for vaginal discharge and vaginal pain (Itching).       Objective:    Physical Exam Constitutional:      Appearance: Normal appearance.  Genitourinary:    General: Normal vulva.     Vagina: Vaginal discharge present. No erythema.     Cervix: Normal.     BP 134/86 (BP Location: Left Arm, Patient Position: Sitting, Cuff Size: Normal)   Pulse 74   Wt 138 lb (62.6 kg)   SpO2 98%   BMI 23.87 kg/m  Wt Readings from Last 3 Encounters:  12/19/23 138 lb (62.6 kg)  11/21/23 134 lb (60.8 kg)  10/01/23 139 lb (63 kg)        Lisa Wang, CMA present as chaperone.   Wet prep + clue cells (+ odor)  Assessment & Plan:   Problem List Items Addressed This Visit   None Visit Diagnoses       Bacterial vaginosis    -  Primary   Relevant Medications   metroNIDAZOLE  (METROGEL ) 0.75 % vaginal gel     Vaginal itching       Relevant Orders   WET PREP FOR TRICH, YEAST, CLUE      Plan: Metrogel  0.75% nightly x 5 nights.   Return if symptoms worsen or fail to improve.    Lisa DELENA Shutter DNP, 12:26 PM 12/19/2023

## 2024-02-29 ENCOUNTER — Other Ambulatory Visit: Payer: Self-pay | Admitting: Nurse Practitioner

## 2024-02-29 DIAGNOSIS — Z1231 Encounter for screening mammogram for malignant neoplasm of breast: Secondary | ICD-10-CM

## 2024-04-03 ENCOUNTER — Ambulatory Visit
